# Patient Record
Sex: Male | Born: 1945 | Race: White | Hispanic: No | Marital: Married | Smoking: Former smoker
Health system: Southern US, Community
[De-identification: ages and names within clinical notes are randomized; demographics above are authoritative.]

## PROBLEM LIST (undated history)

## (undated) DIAGNOSIS — R972 Elevated prostate specific antigen [PSA]: Secondary | ICD-10-CM

## (undated) DIAGNOSIS — I714 Abdominal aortic aneurysm, without rupture, unspecified: Secondary | ICD-10-CM

## (undated) DIAGNOSIS — I10 Essential (primary) hypertension: Secondary | ICD-10-CM

## (undated) DIAGNOSIS — C61 Malignant neoplasm of prostate: Secondary | ICD-10-CM

## (undated) DIAGNOSIS — I2699 Other pulmonary embolism without acute cor pulmonale: Secondary | ICD-10-CM

## (undated) DIAGNOSIS — J449 Chronic obstructive pulmonary disease, unspecified: Secondary | ICD-10-CM

## (undated) DIAGNOSIS — I82409 Acute embolism and thrombosis of unspecified deep veins of unspecified lower extremity: Secondary | ICD-10-CM

## (undated) HISTORY — PX: CATARACT EXTRACTION: SUR2

## (undated) HISTORY — PX: TOTAL HIP ARTHROPLASTY: SHX124

## (undated) HISTORY — PX: KNEE SURGERY: SHX244

---

## 2008-06-16 HISTORY — PX: OTHER SURGICAL HISTORY: SHX169

## 2008-10-09 ENCOUNTER — Inpatient Hospital Stay: Payer: Self-pay | Admitting: Internal Medicine

## 2008-11-23 ENCOUNTER — Ambulatory Visit: Payer: Self-pay | Admitting: Family Medicine

## 2009-01-24 ENCOUNTER — Ambulatory Visit: Payer: Self-pay | Admitting: Family Medicine

## 2009-03-27 ENCOUNTER — Ambulatory Visit: Payer: Self-pay | Admitting: Ophthalmology

## 2009-04-10 ENCOUNTER — Ambulatory Visit: Payer: Self-pay | Admitting: Ophthalmology

## 2009-05-04 DIAGNOSIS — E559 Vitamin D deficiency, unspecified: Secondary | ICD-10-CM | POA: Insufficient documentation

## 2009-09-03 ENCOUNTER — Emergency Department: Payer: Self-pay | Admitting: Emergency Medicine

## 2009-10-03 ENCOUNTER — Ambulatory Visit: Payer: Self-pay | Admitting: Family Medicine

## 2010-06-03 ENCOUNTER — Ambulatory Visit: Payer: Self-pay | Admitting: Family Medicine

## 2010-07-18 ENCOUNTER — Emergency Department: Payer: Self-pay | Admitting: Emergency Medicine

## 2010-07-24 ENCOUNTER — Ambulatory Visit: Payer: Self-pay | Admitting: Family Medicine

## 2011-06-25 DIAGNOSIS — I10 Essential (primary) hypertension: Secondary | ICD-10-CM | POA: Diagnosis not present

## 2011-06-25 DIAGNOSIS — J449 Chronic obstructive pulmonary disease, unspecified: Secondary | ICD-10-CM | POA: Diagnosis not present

## 2011-06-25 DIAGNOSIS — E7800 Pure hypercholesterolemia, unspecified: Secondary | ICD-10-CM | POA: Diagnosis not present

## 2011-06-25 DIAGNOSIS — I2699 Other pulmonary embolism without acute cor pulmonale: Secondary | ICD-10-CM | POA: Diagnosis not present

## 2011-07-23 DIAGNOSIS — J449 Chronic obstructive pulmonary disease, unspecified: Secondary | ICD-10-CM | POA: Diagnosis not present

## 2011-07-23 DIAGNOSIS — I10 Essential (primary) hypertension: Secondary | ICD-10-CM | POA: Diagnosis not present

## 2011-07-23 DIAGNOSIS — E7800 Pure hypercholesterolemia, unspecified: Secondary | ICD-10-CM | POA: Diagnosis not present

## 2011-07-23 DIAGNOSIS — M069 Rheumatoid arthritis, unspecified: Secondary | ICD-10-CM | POA: Diagnosis not present

## 2011-07-23 DIAGNOSIS — M810 Age-related osteoporosis without current pathological fracture: Secondary | ICD-10-CM | POA: Diagnosis not present

## 2011-07-23 DIAGNOSIS — I2699 Other pulmonary embolism without acute cor pulmonale: Secondary | ICD-10-CM | POA: Diagnosis not present

## 2011-07-28 DIAGNOSIS — J90 Pleural effusion, not elsewhere classified: Secondary | ICD-10-CM | POA: Diagnosis not present

## 2011-07-28 DIAGNOSIS — J438 Other emphysema: Secondary | ICD-10-CM | POA: Diagnosis not present

## 2011-07-28 DIAGNOSIS — I714 Abdominal aortic aneurysm, without rupture: Secondary | ICD-10-CM | POA: Diagnosis not present

## 2011-07-28 DIAGNOSIS — I7103 Dissection of thoracoabdominal aorta: Secondary | ICD-10-CM | POA: Diagnosis not present

## 2011-07-28 DIAGNOSIS — K7689 Other specified diseases of liver: Secondary | ICD-10-CM | POA: Diagnosis not present

## 2011-08-20 DIAGNOSIS — E7800 Pure hypercholesterolemia, unspecified: Secondary | ICD-10-CM | POA: Diagnosis not present

## 2011-08-20 DIAGNOSIS — I2699 Other pulmonary embolism without acute cor pulmonale: Secondary | ICD-10-CM | POA: Diagnosis not present

## 2011-08-20 DIAGNOSIS — J449 Chronic obstructive pulmonary disease, unspecified: Secondary | ICD-10-CM | POA: Diagnosis not present

## 2011-08-20 DIAGNOSIS — I10 Essential (primary) hypertension: Secondary | ICD-10-CM | POA: Diagnosis not present

## 2011-08-25 DIAGNOSIS — I2699 Other pulmonary embolism without acute cor pulmonale: Secondary | ICD-10-CM | POA: Diagnosis not present

## 2011-08-25 DIAGNOSIS — E7800 Pure hypercholesterolemia, unspecified: Secondary | ICD-10-CM | POA: Diagnosis not present

## 2011-08-25 DIAGNOSIS — D509 Iron deficiency anemia, unspecified: Secondary | ICD-10-CM | POA: Diagnosis not present

## 2011-08-25 DIAGNOSIS — I10 Essential (primary) hypertension: Secondary | ICD-10-CM | POA: Diagnosis not present

## 2011-09-17 DIAGNOSIS — I2699 Other pulmonary embolism without acute cor pulmonale: Secondary | ICD-10-CM | POA: Diagnosis not present

## 2011-09-22 DIAGNOSIS — Z79899 Other long term (current) drug therapy: Secondary | ICD-10-CM | POA: Diagnosis not present

## 2011-09-25 DIAGNOSIS — M069 Rheumatoid arthritis, unspecified: Secondary | ICD-10-CM | POA: Diagnosis not present

## 2011-09-25 DIAGNOSIS — M810 Age-related osteoporosis without current pathological fracture: Secondary | ICD-10-CM | POA: Diagnosis not present

## 2011-10-15 DIAGNOSIS — I10 Essential (primary) hypertension: Secondary | ICD-10-CM | POA: Diagnosis not present

## 2011-10-15 DIAGNOSIS — I2699 Other pulmonary embolism without acute cor pulmonale: Secondary | ICD-10-CM | POA: Diagnosis not present

## 2011-10-15 DIAGNOSIS — E7800 Pure hypercholesterolemia, unspecified: Secondary | ICD-10-CM | POA: Diagnosis not present

## 2011-11-19 DIAGNOSIS — Z7901 Long term (current) use of anticoagulants: Secondary | ICD-10-CM | POA: Diagnosis not present

## 2011-11-19 DIAGNOSIS — I2699 Other pulmonary embolism without acute cor pulmonale: Secondary | ICD-10-CM | POA: Diagnosis not present

## 2011-11-19 DIAGNOSIS — J449 Chronic obstructive pulmonary disease, unspecified: Secondary | ICD-10-CM | POA: Diagnosis not present

## 2011-11-19 DIAGNOSIS — J069 Acute upper respiratory infection, unspecified: Secondary | ICD-10-CM | POA: Diagnosis not present

## 2011-12-08 ENCOUNTER — Ambulatory Visit: Payer: Self-pay | Admitting: Family Medicine

## 2011-12-08 DIAGNOSIS — Z7901 Long term (current) use of anticoagulants: Secondary | ICD-10-CM | POA: Diagnosis not present

## 2011-12-08 DIAGNOSIS — J90 Pleural effusion, not elsewhere classified: Secondary | ICD-10-CM | POA: Diagnosis not present

## 2011-12-08 DIAGNOSIS — E559 Vitamin D deficiency, unspecified: Secondary | ICD-10-CM | POA: Diagnosis not present

## 2011-12-08 DIAGNOSIS — J449 Chronic obstructive pulmonary disease, unspecified: Secondary | ICD-10-CM | POA: Diagnosis not present

## 2011-12-08 DIAGNOSIS — J209 Acute bronchitis, unspecified: Secondary | ICD-10-CM | POA: Diagnosis not present

## 2011-12-08 DIAGNOSIS — I10 Essential (primary) hypertension: Secondary | ICD-10-CM | POA: Diagnosis not present

## 2011-12-08 DIAGNOSIS — E7800 Pure hypercholesterolemia, unspecified: Secondary | ICD-10-CM | POA: Diagnosis not present

## 2011-12-08 DIAGNOSIS — I2699 Other pulmonary embolism without acute cor pulmonale: Secondary | ICD-10-CM | POA: Diagnosis not present

## 2011-12-08 DIAGNOSIS — E538 Deficiency of other specified B group vitamins: Secondary | ICD-10-CM | POA: Diagnosis not present

## 2011-12-08 DIAGNOSIS — R059 Cough, unspecified: Secondary | ICD-10-CM | POA: Diagnosis not present

## 2011-12-31 DIAGNOSIS — Z79899 Other long term (current) drug therapy: Secondary | ICD-10-CM | POA: Diagnosis not present

## 2011-12-31 DIAGNOSIS — M069 Rheumatoid arthritis, unspecified: Secondary | ICD-10-CM | POA: Diagnosis not present

## 2012-01-07 DIAGNOSIS — Z7901 Long term (current) use of anticoagulants: Secondary | ICD-10-CM | POA: Diagnosis not present

## 2012-01-07 DIAGNOSIS — E559 Vitamin D deficiency, unspecified: Secondary | ICD-10-CM | POA: Diagnosis not present

## 2012-01-07 DIAGNOSIS — J209 Acute bronchitis, unspecified: Secondary | ICD-10-CM | POA: Diagnosis not present

## 2012-01-07 DIAGNOSIS — J449 Chronic obstructive pulmonary disease, unspecified: Secondary | ICD-10-CM | POA: Diagnosis not present

## 2012-01-27 DIAGNOSIS — M069 Rheumatoid arthritis, unspecified: Secondary | ICD-10-CM | POA: Diagnosis not present

## 2012-02-11 DIAGNOSIS — Z7901 Long term (current) use of anticoagulants: Secondary | ICD-10-CM | POA: Diagnosis not present

## 2012-02-11 DIAGNOSIS — J209 Acute bronchitis, unspecified: Secondary | ICD-10-CM | POA: Diagnosis not present

## 2012-02-11 DIAGNOSIS — J449 Chronic obstructive pulmonary disease, unspecified: Secondary | ICD-10-CM | POA: Diagnosis not present

## 2012-02-11 DIAGNOSIS — E559 Vitamin D deficiency, unspecified: Secondary | ICD-10-CM | POA: Diagnosis not present

## 2012-03-10 DIAGNOSIS — I2699 Other pulmonary embolism without acute cor pulmonale: Secondary | ICD-10-CM | POA: Diagnosis not present

## 2012-03-10 DIAGNOSIS — E7800 Pure hypercholesterolemia, unspecified: Secondary | ICD-10-CM | POA: Diagnosis not present

## 2012-03-10 DIAGNOSIS — Z0000 Encounter for general adult medical examination without abnormal findings: Secondary | ICD-10-CM | POA: Diagnosis not present

## 2012-03-10 DIAGNOSIS — I10 Essential (primary) hypertension: Secondary | ICD-10-CM | POA: Diagnosis not present

## 2012-03-10 DIAGNOSIS — Z1159 Encounter for screening for other viral diseases: Secondary | ICD-10-CM | POA: Diagnosis not present

## 2012-03-10 DIAGNOSIS — Z86718 Personal history of other venous thrombosis and embolism: Secondary | ICD-10-CM | POA: Diagnosis not present

## 2012-03-10 DIAGNOSIS — Z125 Encounter for screening for malignant neoplasm of prostate: Secondary | ICD-10-CM | POA: Diagnosis not present

## 2012-03-10 DIAGNOSIS — J449 Chronic obstructive pulmonary disease, unspecified: Secondary | ICD-10-CM | POA: Diagnosis not present

## 2012-03-31 DIAGNOSIS — E559 Vitamin D deficiency, unspecified: Secondary | ICD-10-CM | POA: Diagnosis not present

## 2012-03-31 DIAGNOSIS — R972 Elevated prostate specific antigen [PSA]: Secondary | ICD-10-CM | POA: Diagnosis not present

## 2012-04-08 DIAGNOSIS — Z86711 Personal history of pulmonary embolism: Secondary | ICD-10-CM | POA: Diagnosis not present

## 2012-04-08 DIAGNOSIS — Z7901 Long term (current) use of anticoagulants: Secondary | ICD-10-CM | POA: Diagnosis not present

## 2012-04-20 DIAGNOSIS — R972 Elevated prostate specific antigen [PSA]: Secondary | ICD-10-CM | POA: Diagnosis not present

## 2012-05-04 DIAGNOSIS — M069 Rheumatoid arthritis, unspecified: Secondary | ICD-10-CM | POA: Diagnosis not present

## 2012-05-04 DIAGNOSIS — Z79899 Other long term (current) drug therapy: Secondary | ICD-10-CM | POA: Diagnosis not present

## 2012-05-19 DIAGNOSIS — R972 Elevated prostate specific antigen [PSA]: Secondary | ICD-10-CM | POA: Diagnosis not present

## 2012-05-19 DIAGNOSIS — N429 Disorder of prostate, unspecified: Secondary | ICD-10-CM | POA: Diagnosis not present

## 2012-05-19 DIAGNOSIS — C61 Malignant neoplasm of prostate: Secondary | ICD-10-CM | POA: Diagnosis not present

## 2012-05-19 HISTORY — PX: PROSTATE BIOPSY: SHX241

## 2012-05-26 DIAGNOSIS — I714 Abdominal aortic aneurysm, without rupture: Secondary | ICD-10-CM | POA: Diagnosis not present

## 2012-05-26 DIAGNOSIS — Z86718 Personal history of other venous thrombosis and embolism: Secondary | ICD-10-CM | POA: Diagnosis not present

## 2012-05-28 ENCOUNTER — Ambulatory Visit: Payer: Self-pay | Admitting: Family Medicine

## 2012-05-28 DIAGNOSIS — I714 Abdominal aortic aneurysm, without rupture: Secondary | ICD-10-CM | POA: Diagnosis not present

## 2012-05-28 DIAGNOSIS — Z86718 Personal history of other venous thrombosis and embolism: Secondary | ICD-10-CM | POA: Diagnosis not present

## 2012-06-02 DIAGNOSIS — C61 Malignant neoplasm of prostate: Secondary | ICD-10-CM | POA: Diagnosis not present

## 2012-06-23 DIAGNOSIS — M7020 Olecranon bursitis, unspecified elbow: Secondary | ICD-10-CM | POA: Diagnosis not present

## 2012-06-23 DIAGNOSIS — I2699 Other pulmonary embolism without acute cor pulmonale: Secondary | ICD-10-CM | POA: Diagnosis not present

## 2012-06-23 DIAGNOSIS — L0291 Cutaneous abscess, unspecified: Secondary | ICD-10-CM | POA: Diagnosis not present

## 2012-06-28 DIAGNOSIS — M7020 Olecranon bursitis, unspecified elbow: Secondary | ICD-10-CM | POA: Diagnosis not present

## 2012-06-28 DIAGNOSIS — I2699 Other pulmonary embolism without acute cor pulmonale: Secondary | ICD-10-CM | POA: Diagnosis not present

## 2012-06-28 DIAGNOSIS — H35389 Toxic maculopathy, unspecified eye: Secondary | ICD-10-CM | POA: Diagnosis not present

## 2012-06-30 DIAGNOSIS — C61 Malignant neoplasm of prostate: Secondary | ICD-10-CM | POA: Diagnosis not present

## 2012-07-06 DIAGNOSIS — L0291 Cutaneous abscess, unspecified: Secondary | ICD-10-CM | POA: Diagnosis not present

## 2012-07-06 DIAGNOSIS — L0390 Cellulitis, unspecified: Secondary | ICD-10-CM | POA: Diagnosis not present

## 2012-07-06 DIAGNOSIS — I2699 Other pulmonary embolism without acute cor pulmonale: Secondary | ICD-10-CM | POA: Diagnosis not present

## 2012-07-20 DIAGNOSIS — I2699 Other pulmonary embolism without acute cor pulmonale: Secondary | ICD-10-CM | POA: Diagnosis not present

## 2012-07-20 DIAGNOSIS — L0291 Cutaneous abscess, unspecified: Secondary | ICD-10-CM | POA: Diagnosis not present

## 2012-07-20 DIAGNOSIS — L0390 Cellulitis, unspecified: Secondary | ICD-10-CM | POA: Diagnosis not present

## 2012-07-27 DIAGNOSIS — M069 Rheumatoid arthritis, unspecified: Secondary | ICD-10-CM | POA: Diagnosis not present

## 2012-07-27 DIAGNOSIS — Z79899 Other long term (current) drug therapy: Secondary | ICD-10-CM | POA: Diagnosis not present

## 2012-08-03 DIAGNOSIS — M069 Rheumatoid arthritis, unspecified: Secondary | ICD-10-CM | POA: Diagnosis not present

## 2012-08-03 DIAGNOSIS — D649 Anemia, unspecified: Secondary | ICD-10-CM | POA: Diagnosis not present

## 2012-08-05 DIAGNOSIS — L0390 Cellulitis, unspecified: Secondary | ICD-10-CM | POA: Diagnosis not present

## 2012-08-05 DIAGNOSIS — I2699 Other pulmonary embolism without acute cor pulmonale: Secondary | ICD-10-CM | POA: Diagnosis not present

## 2012-08-05 DIAGNOSIS — Z7901 Long term (current) use of anticoagulants: Secondary | ICD-10-CM | POA: Diagnosis not present

## 2012-08-09 DIAGNOSIS — J438 Other emphysema: Secondary | ICD-10-CM | POA: Diagnosis not present

## 2012-08-09 DIAGNOSIS — J9819 Other pulmonary collapse: Secondary | ICD-10-CM | POA: Diagnosis not present

## 2012-08-09 DIAGNOSIS — J90 Pleural effusion, not elsewhere classified: Secondary | ICD-10-CM | POA: Diagnosis not present

## 2012-08-09 DIAGNOSIS — I714 Abdominal aortic aneurysm, without rupture: Secondary | ICD-10-CM | POA: Diagnosis not present

## 2012-08-09 DIAGNOSIS — I7103 Dissection of thoracoabdominal aorta: Secondary | ICD-10-CM | POA: Diagnosis not present

## 2012-08-19 DIAGNOSIS — I2699 Other pulmonary embolism without acute cor pulmonale: Secondary | ICD-10-CM | POA: Diagnosis not present

## 2012-08-19 DIAGNOSIS — E785 Hyperlipidemia, unspecified: Secondary | ICD-10-CM | POA: Diagnosis not present

## 2012-08-19 DIAGNOSIS — J449 Chronic obstructive pulmonary disease, unspecified: Secondary | ICD-10-CM | POA: Diagnosis not present

## 2012-08-19 DIAGNOSIS — I10 Essential (primary) hypertension: Secondary | ICD-10-CM | POA: Diagnosis not present

## 2012-09-06 DIAGNOSIS — I10 Essential (primary) hypertension: Secondary | ICD-10-CM | POA: Diagnosis not present

## 2012-09-06 DIAGNOSIS — E7800 Pure hypercholesterolemia, unspecified: Secondary | ICD-10-CM | POA: Diagnosis not present

## 2012-09-13 DIAGNOSIS — C61 Malignant neoplasm of prostate: Secondary | ICD-10-CM | POA: Diagnosis not present

## 2012-09-23 DIAGNOSIS — Z86711 Personal history of pulmonary embolism: Secondary | ICD-10-CM | POA: Diagnosis not present

## 2012-09-23 DIAGNOSIS — J449 Chronic obstructive pulmonary disease, unspecified: Secondary | ICD-10-CM | POA: Diagnosis not present

## 2012-09-23 DIAGNOSIS — I2699 Other pulmonary embolism without acute cor pulmonale: Secondary | ICD-10-CM | POA: Diagnosis not present

## 2012-09-23 DIAGNOSIS — I10 Essential (primary) hypertension: Secondary | ICD-10-CM | POA: Diagnosis not present

## 2012-09-29 ENCOUNTER — Telehealth: Payer: Self-pay | Admitting: *Deleted

## 2012-09-29 NOTE — Telephone Encounter (Signed)
CALLED PATIENT TO ALTER [place] VISIT FOR 10-07-12 DUE TO DR. Dayton Scrape BEING IN THE OR, RESCHEDULED FOR 10-06-12 - 2:30 PM FOR 3:00 PM APPT., PATIENT AGREED TO NEW TIME AND DATE

## 2012-09-30 ENCOUNTER — Ambulatory Visit: Payer: Self-pay | Admitting: Radiation Oncology

## 2012-09-30 ENCOUNTER — Ambulatory Visit: Payer: Self-pay

## 2012-10-06 ENCOUNTER — Encounter: Payer: Self-pay | Admitting: Oncology

## 2012-10-06 ENCOUNTER — Ambulatory Visit
Admission: RE | Admit: 2012-10-06 | Discharge: 2012-10-06 | Disposition: A | Discharge status: Home / Self Care | Payer: Medicare Other | Source: Ambulatory Visit | Attending: Radiation Oncology | Admitting: Radiation Oncology

## 2012-10-06 VITALS — BP 106/62 | HR 70 | Temp 97.6°F | Ht 68.0 in | Wt 151.7 lb

## 2012-10-06 DIAGNOSIS — Z86718 Personal history of other venous thrombosis and embolism: Secondary | ICD-10-CM | POA: Diagnosis not present

## 2012-10-06 DIAGNOSIS — Z96649 Presence of unspecified artificial hip joint: Secondary | ICD-10-CM | POA: Diagnosis not present

## 2012-10-06 DIAGNOSIS — C61 Malignant neoplasm of prostate: Secondary | ICD-10-CM | POA: Diagnosis not present

## 2012-10-06 DIAGNOSIS — Z86711 Personal history of pulmonary embolism: Secondary | ICD-10-CM | POA: Insufficient documentation

## 2012-10-06 DIAGNOSIS — I10 Essential (primary) hypertension: Secondary | ICD-10-CM | POA: Insufficient documentation

## 2012-10-06 DIAGNOSIS — I714 Abdominal aortic aneurysm, without rupture, unspecified: Secondary | ICD-10-CM | POA: Insufficient documentation

## 2012-10-06 DIAGNOSIS — J449 Chronic obstructive pulmonary disease, unspecified: Secondary | ICD-10-CM | POA: Diagnosis not present

## 2012-10-06 DIAGNOSIS — Z7901 Long term (current) use of anticoagulants: Secondary | ICD-10-CM | POA: Insufficient documentation

## 2012-10-06 DIAGNOSIS — Z87891 Personal history of nicotine dependence: Secondary | ICD-10-CM | POA: Diagnosis not present

## 2012-10-06 DIAGNOSIS — J4489 Other specified chronic obstructive pulmonary disease: Secondary | ICD-10-CM | POA: Insufficient documentation

## 2012-10-06 HISTORY — DX: Acute embolism and thrombosis of unspecified deep veins of unspecified lower extremity: I82.409

## 2012-10-06 HISTORY — DX: Chronic obstructive pulmonary disease, unspecified: J44.9

## 2012-10-06 HISTORY — DX: Other pulmonary embolism without acute cor pulmonale: I26.99

## 2012-10-06 HISTORY — DX: Abdominal aortic aneurysm, without rupture: I71.4

## 2012-10-06 HISTORY — DX: Abdominal aortic aneurysm, without rupture, unspecified: I71.40

## 2012-10-06 HISTORY — DX: Elevated prostate specific antigen (PSA): R97.20

## 2012-10-06 HISTORY — DX: Essential (primary) hypertension: I10

## 2012-10-06 HISTORY — DX: Malignant neoplasm of prostate: C61

## 2012-10-06 NOTE — Progress Notes (Addendum)
[facility] Radiation Oncology NEW PATIENT EVALUATION  Name: Juan Fernandez MRN: [id number]  Date:   10/06/2012           DOB: 1946-01-13  Status: outpatient   CC: Clydell Hakim, MD  Sebastian Ache, MD , Dr. Carmina Miller, FAX # [phone]   REFERRING PHYSICIAN: Sebastian Ache, MD   DIAGNOSIS: Stage TI C. intermediate risk adenocarcinoma prostate   HISTORY OF PRESENT ILLNESS:  Juan Fernandez is a 67 y.o. male who is seen today for the courtesy of Dr. Berneice Heinrich for evaluation of his stage TI C. intermediate risk adenocarcinoma prostate. He was initially evaluated by Dr. Jamse Belfast, his primary care physician, who noted a rise in his PSA to 5.7 with a free PSA percentage of 10.2. I do not have his previous PSA determinations. He was referred to Dr. Orson Slick who performed ultrasound-guided biopsies on 05/19/2012. He was found to have 4 of 12 specimens diagnostic for adenocarcinoma. He had Gleason 7 (4+3) involving 46% of the total biopsy length from the right base. He had Gleason 7 (3+4) involving 35% of one core from the right mid gland and 1% of one core from the left lateral apex. Gleason 6 (3+3) was found in 7% of one core from the left base. His gland volume was approximately 28 cc. He is doing recently well from a GU and GI standpoint. His I PSS score is 4. He does have erectile dysfunction. 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 Of note is that he had a right hip replacement. He was seen by Dr. Berneice Heinrich for consideration of surgery, and he felt that he may be a better candidate for radiation therapy rather than surgery.  PREVIOUS RADIATION THERAPY: No   PAST MEDICAL HISTORY:  has a past medical history of Prostate cancer; Elevated PSA; DVT (deep venous thrombosis); PE (pulmonary embolism); COPD (chronic obstructive pulmonary disease);  AAA (abdominal aortic aneurysm); and HTN (hypertension).     PAST SURGICAL HISTORY:  Past Surgical History  Procedure Laterality Date  . Total hip arthroplasty Right   . Knee surgery Left   . Prostate biopsy  05/19/2012    Gleason 3+3=6 l base, 3+4=7 RM and LA, RMB  . Ivc filter  2010     FAMILY HISTORY: His father's health is unknown. His mother died from complications of COPD and non-Hodgkin's lymphoma at age 26. No known family history of prostate cancer.  SOCIAL HISTORY:  reports that he quit smoking about 9 years ago. He started smoking about 50 years ago. He does not have any smokeless tobacco history on file. He reports that he does not drink alcohol or use illicit drugs. Were for the past 3 years, 3 children. Retired after 20 years in the [employer]. 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  ALLERGIES: Review of patient's allergies indicates not on file.   MEDICATIONS:  Current Outpatient Prescriptions  Medication Sig Dispense Refill  . alendronate (FOSAMAX) 10 MG tablet Take 20 mg by mouth once a week. Take with a full glass of water on an empty stomach.      Marland Kitchen aspirin 325 MG tablet Take 325 mg by mouth daily.      . calcium carbonate (OS-CAL) 600 MG TABS Take 600 mg by mouth 2 (two) times daily with a meal.      . Cyanocobalamin (NASCOBAL) 500 MCG/0.1ML SOLN Place into  the nose daily.      . enalapril (VASOTEC) 5 MG tablet Take 5 mg by mouth 2 (two) times daily.      Marland Kitchen esomeprazole (NEXIUM) 40 MG capsule Take 40 mg by mouth 2 (two) times daily.      . folic acid (FOLVITE) 1 MG tablet Take 1 mg by mouth daily.      . hydroxychloroquine (PLAQUENIL) 200 MG tablet Take by mouth daily.      . methotrexate (RHEUMATREX) 2.5 MG tablet Take 2.5 mg by mouth once a week. Caution:Chemotherapy. Protect from light.      . metoprolol (LOPRESSOR) 50 MG tablet Take 25 mg by mouth daily.      . metoprolol succinate (TOPROL-XL) 50 MG 24 hr tablet Take 50 mg by mouth daily. Take with or immediately  following a meal.      . mometasone (NASONEX) 50 MCG/ACT nasal spray Place 2 sprays into the nose.      . simvastatin (ZOCOR) 20 MG tablet Take 20 mg by mouth every evening.      . tiotropium (SPIRIVA) 18 MCG inhalation capsule Place 18 mcg into inhaler and inhale daily.      Marland Kitchen warfarin (COUMADIN) 5 MG tablet Take 5 mg by mouth daily. Takes 1/2 tablet Tuesday, Thursday, Sat and Sunday.  Takes full tablet Monday, Wednesday and Friday      . ferrous fumarate (HEMOCYTE - 106 MG FE) 325 (106 FE) MG TABS Take 1 tablet by mouth.       No current facility-administered medications for this encounter.     REVIEW OF SYSTEMS:  Pertinent items are noted in HPI.    PHYSICAL EXAM:  height is 5\' 8"  (1.727 m) and weight is 151 lb 11.2 oz (68.811 kg). His temperature is 97.6 F (36.4 C). His blood pressure is 106/62 and his pulse is 70.   Alert and oriented. He is in no acute respiratory distress. Head and neck examination: Grossly unremarkable. Nodes: Without palpable cervical, or supraclavicular lymphadenopathy. Chest: Lungs clear but breath sounds distant. Heart: Regular in rhythm. Back: Without spinal or CVA tenderness. Abdomen: Without masses organomegaly. Genitalia: Unremarkable to inspection. Rectal: The prostate gland is normal in size and is without focal induration or nodularity. Extremities: Without edema. Neurologic examination: Grossly nonfocal.   LABORATORY DATA:  No results found for this basename: WBC, HGB, HCT, MCV, PLT   No results found for this basename: NA, K, CL, CO2   No results found for this basename: ALT, AST, GGT, ALKPHOS, BILITOT   PSA 5.7   IMPRESSION: Stage TI C. intermediate risk adenocarcinoma prostate. I explained to the patient and his daughter that his prognosis is related to his stage, PSA level, and Gleason score. His stage and PSA level are favorable while his Gleason score of 7 is of intermediate favorability. I do not feel strongly that he needs to have a bone scan,  or CT scan for staging purposes. We discussed surgery versus close surveillance with delayed androgen deprivation therapy, and radiation therapy. Radiation therapy options include seed implantation with or without 5 weeks of external beam radiation therapy or 8 weeks of external beam/IMRT. We discussed the potential acute and late toxicities of radiation therapy. After lengthy discussion he is most interested in external beam/IMRT which I think would be a good choice for him. I told him  that he can certainly have his treatment closer to home with Dr. Carmina Miller in [place]. Dr. Rushie Chestnut may ask Dr. Berneice Heinrich to  placed 3 gold markers within the prostate for image guidance during his IMRT. Understand that he is currently undergoing evaluation for his abdominal aortic aneurysm, and this will need to be coordinated in [place].   PLAN: As discussed above. The patient see Dr. Berneice Heinrich this coming Monday, April 28 , and he can make a formal referral to Dr. Rushie Chestnut. I spoke with Dr. Rushie Chestnut this afternoon, and he will await a call from Dr. Berneice Heinrich.   I spent 60 minutes minutes face to face with the patient and more than 50% of that time was spent in counseling and/or coordination of care.

## 2012-10-06 NOTE — Progress Notes (Signed)
GU Location of Tumor / Histology: prostate  If Prostate Cancer, Gleason Score is (3 + 3=6 L Base, 3+4=7 RM, LA and RMB) and PSA is (5.7) and prostate volume is 26 mL  Patient presented with signs/symptoms of: elevated PSA  Biopsies of prostate (if applicable) revealed: adenocarcinoma  Past/Anticipated interventions by urology, if any: prostate biopsy, possible prostatectomy  Past/Anticipated interventions by medical oncology, if any: none  Weight changes, if any:  no  Bowel/Bladder complaints, if any:   Occasional weak stream, trouble emptying bladder  Nausea/Vomiting, if any:  no  Pain issues, if any: no    SAFETY ISSUES:  Prior radiation? no  Pacemaker/ICD? no   Possible current pregnancy? no  Is the patient on methotrexate?  yes  Current Complaints / other details:   Juan Fernandez here for consult for prostate cancer.  He denies pain, fatigue, diarrhea, nocturia and hematuria.

## 2012-10-06 NOTE — Progress Notes (Signed)
Please see the Nurse Progress Note in the MD Initial Consult Encounter for this patient. 

## 2012-10-07 ENCOUNTER — Telehealth: Payer: Self-pay | Admitting: Radiation Oncology

## 2012-10-07 ENCOUNTER — Ambulatory Visit: Payer: Medicare Other | Admitting: Radiation Oncology

## 2012-10-07 ENCOUNTER — Ambulatory Visit: Payer: Medicare Other

## 2012-10-07 NOTE — Telephone Encounter (Signed)
Per Dr. Dayton Scrape, faxed chart to Dr. Rushie Chestnut.  Received confirmation.

## 2012-10-11 DIAGNOSIS — C61 Malignant neoplasm of prostate: Secondary | ICD-10-CM | POA: Diagnosis not present

## 2012-10-12 DIAGNOSIS — J449 Chronic obstructive pulmonary disease, unspecified: Secondary | ICD-10-CM | POA: Diagnosis not present

## 2012-10-12 DIAGNOSIS — Z86711 Personal history of pulmonary embolism: Secondary | ICD-10-CM | POA: Diagnosis not present

## 2012-10-12 DIAGNOSIS — I2699 Other pulmonary embolism without acute cor pulmonale: Secondary | ICD-10-CM | POA: Diagnosis not present

## 2012-10-12 DIAGNOSIS — I10 Essential (primary) hypertension: Secondary | ICD-10-CM | POA: Diagnosis not present

## 2012-10-13 NOTE — Addendum Note (Signed)
Encounter addended by: Shavanna Furnari Mintz Madie Cahn, RN on: 10/13/2012  6:50 PM<BR>     Documentation filed: Charges VN

## 2012-10-18 ENCOUNTER — Ambulatory Visit: Payer: Self-pay | Admitting: Radiation Oncology

## 2012-10-18 DIAGNOSIS — K219 Gastro-esophageal reflux disease without esophagitis: Secondary | ICD-10-CM | POA: Diagnosis not present

## 2012-10-18 DIAGNOSIS — Z79899 Other long term (current) drug therapy: Secondary | ICD-10-CM | POA: Diagnosis not present

## 2012-10-18 DIAGNOSIS — Z5181 Encounter for therapeutic drug level monitoring: Secondary | ICD-10-CM | POA: Diagnosis not present

## 2012-10-18 DIAGNOSIS — M069 Rheumatoid arthritis, unspecified: Secondary | ICD-10-CM | POA: Diagnosis not present

## 2012-10-18 DIAGNOSIS — Z7901 Long term (current) use of anticoagulants: Secondary | ICD-10-CM | POA: Diagnosis not present

## 2012-10-18 DIAGNOSIS — Z86718 Personal history of other venous thrombosis and embolism: Secondary | ICD-10-CM | POA: Diagnosis not present

## 2012-10-18 DIAGNOSIS — J449 Chronic obstructive pulmonary disease, unspecified: Secondary | ICD-10-CM | POA: Diagnosis not present

## 2012-10-18 DIAGNOSIS — I252 Old myocardial infarction: Secondary | ICD-10-CM | POA: Diagnosis not present

## 2012-10-18 DIAGNOSIS — Z7982 Long term (current) use of aspirin: Secondary | ICD-10-CM | POA: Diagnosis not present

## 2012-10-18 DIAGNOSIS — Z510 Encounter for antineoplastic radiation therapy: Secondary | ICD-10-CM | POA: Diagnosis not present

## 2012-10-18 DIAGNOSIS — E7800 Pure hypercholesterolemia, unspecified: Secondary | ICD-10-CM | POA: Diagnosis not present

## 2012-10-18 DIAGNOSIS — M810 Age-related osteoporosis without current pathological fracture: Secondary | ICD-10-CM | POA: Diagnosis not present

## 2012-10-18 DIAGNOSIS — E785 Hyperlipidemia, unspecified: Secondary | ICD-10-CM | POA: Diagnosis not present

## 2012-10-18 DIAGNOSIS — C61 Malignant neoplasm of prostate: Secondary | ICD-10-CM | POA: Diagnosis not present

## 2012-10-18 DIAGNOSIS — I10 Essential (primary) hypertension: Secondary | ICD-10-CM | POA: Diagnosis not present

## 2012-10-26 DIAGNOSIS — M069 Rheumatoid arthritis, unspecified: Secondary | ICD-10-CM | POA: Diagnosis not present

## 2012-11-02 DIAGNOSIS — IMO0002 Reserved for concepts with insufficient information to code with codable children: Secondary | ICD-10-CM | POA: Diagnosis not present

## 2012-11-02 DIAGNOSIS — C61 Malignant neoplasm of prostate: Secondary | ICD-10-CM | POA: Diagnosis not present

## 2012-11-09 DIAGNOSIS — C61 Malignant neoplasm of prostate: Secondary | ICD-10-CM | POA: Diagnosis not present

## 2012-11-10 DIAGNOSIS — C61 Malignant neoplasm of prostate: Secondary | ICD-10-CM | POA: Diagnosis not present

## 2012-11-14 ENCOUNTER — Ambulatory Visit: Payer: Self-pay | Admitting: Radiation Oncology

## 2012-11-14 DIAGNOSIS — M069 Rheumatoid arthritis, unspecified: Secondary | ICD-10-CM | POA: Diagnosis not present

## 2012-11-14 DIAGNOSIS — Z86718 Personal history of other venous thrombosis and embolism: Secondary | ICD-10-CM | POA: Diagnosis not present

## 2012-11-14 DIAGNOSIS — Z79899 Other long term (current) drug therapy: Secondary | ICD-10-CM | POA: Diagnosis not present

## 2012-11-14 DIAGNOSIS — Z7982 Long term (current) use of aspirin: Secondary | ICD-10-CM | POA: Diagnosis not present

## 2012-11-14 DIAGNOSIS — Z510 Encounter for antineoplastic radiation therapy: Secondary | ICD-10-CM | POA: Diagnosis not present

## 2012-11-14 DIAGNOSIS — I252 Old myocardial infarction: Secondary | ICD-10-CM | POA: Diagnosis not present

## 2012-11-14 DIAGNOSIS — I10 Essential (primary) hypertension: Secondary | ICD-10-CM | POA: Diagnosis not present

## 2012-11-14 DIAGNOSIS — Z7901 Long term (current) use of anticoagulants: Secondary | ICD-10-CM | POA: Diagnosis not present

## 2012-11-14 DIAGNOSIS — M810 Age-related osteoporosis without current pathological fracture: Secondary | ICD-10-CM | POA: Diagnosis not present

## 2012-11-14 DIAGNOSIS — C61 Malignant neoplasm of prostate: Secondary | ICD-10-CM | POA: Diagnosis not present

## 2012-11-14 DIAGNOSIS — Z5181 Encounter for therapeutic drug level monitoring: Secondary | ICD-10-CM | POA: Diagnosis not present

## 2012-11-14 DIAGNOSIS — E7800 Pure hypercholesterolemia, unspecified: Secondary | ICD-10-CM | POA: Diagnosis not present

## 2012-11-14 DIAGNOSIS — E785 Hyperlipidemia, unspecified: Secondary | ICD-10-CM | POA: Diagnosis not present

## 2012-11-14 DIAGNOSIS — K219 Gastro-esophageal reflux disease without esophagitis: Secondary | ICD-10-CM | POA: Diagnosis not present

## 2012-11-14 DIAGNOSIS — J449 Chronic obstructive pulmonary disease, unspecified: Secondary | ICD-10-CM | POA: Diagnosis not present

## 2012-11-22 DIAGNOSIS — C61 Malignant neoplasm of prostate: Secondary | ICD-10-CM | POA: Diagnosis not present

## 2012-11-23 DIAGNOSIS — C61 Malignant neoplasm of prostate: Secondary | ICD-10-CM | POA: Diagnosis not present

## 2012-11-23 DIAGNOSIS — J449 Chronic obstructive pulmonary disease, unspecified: Secondary | ICD-10-CM | POA: Diagnosis not present

## 2012-11-23 DIAGNOSIS — I2699 Other pulmonary embolism without acute cor pulmonale: Secondary | ICD-10-CM | POA: Diagnosis not present

## 2012-11-23 DIAGNOSIS — I10 Essential (primary) hypertension: Secondary | ICD-10-CM | POA: Diagnosis not present

## 2012-11-23 DIAGNOSIS — Z86711 Personal history of pulmonary embolism: Secondary | ICD-10-CM | POA: Diagnosis not present

## 2012-11-24 DIAGNOSIS — C61 Malignant neoplasm of prostate: Secondary | ICD-10-CM | POA: Diagnosis not present

## 2012-11-25 DIAGNOSIS — C61 Malignant neoplasm of prostate: Secondary | ICD-10-CM | POA: Diagnosis not present

## 2012-11-29 DIAGNOSIS — C61 Malignant neoplasm of prostate: Secondary | ICD-10-CM | POA: Diagnosis not present

## 2012-11-30 DIAGNOSIS — C61 Malignant neoplasm of prostate: Secondary | ICD-10-CM | POA: Diagnosis not present

## 2012-12-01 DIAGNOSIS — C61 Malignant neoplasm of prostate: Secondary | ICD-10-CM | POA: Diagnosis not present

## 2012-12-02 DIAGNOSIS — C61 Malignant neoplasm of prostate: Secondary | ICD-10-CM | POA: Diagnosis not present

## 2012-12-02 LAB — CBC CANCER CENTER
Eosinophil #: 0.1 x10 3/mm (ref 0.0–0.7)
Eosinophil %: 1.7 %
HCT: 37.4 % — ABNORMAL LOW (ref 40.0–52.0)
MCH: 30.1 pg (ref 26.0–34.0)
MCHC: 34.4 g/dL (ref 32.0–36.0)
Monocyte %: 8.5 %
Neutrophil #: 3.7 x10 3/mm (ref 1.4–6.5)
RBC: 4.28 10*6/uL — ABNORMAL LOW (ref 4.40–5.90)

## 2012-12-06 DIAGNOSIS — C61 Malignant neoplasm of prostate: Secondary | ICD-10-CM | POA: Diagnosis not present

## 2012-12-07 DIAGNOSIS — I2699 Other pulmonary embolism without acute cor pulmonale: Secondary | ICD-10-CM | POA: Diagnosis not present

## 2012-12-07 DIAGNOSIS — J449 Chronic obstructive pulmonary disease, unspecified: Secondary | ICD-10-CM | POA: Diagnosis not present

## 2012-12-07 DIAGNOSIS — I10 Essential (primary) hypertension: Secondary | ICD-10-CM | POA: Diagnosis not present

## 2012-12-07 DIAGNOSIS — C61 Malignant neoplasm of prostate: Secondary | ICD-10-CM | POA: Diagnosis not present

## 2012-12-07 DIAGNOSIS — Z86711 Personal history of pulmonary embolism: Secondary | ICD-10-CM | POA: Diagnosis not present

## 2012-12-08 DIAGNOSIS — C61 Malignant neoplasm of prostate: Secondary | ICD-10-CM | POA: Diagnosis not present

## 2012-12-09 DIAGNOSIS — C61 Malignant neoplasm of prostate: Secondary | ICD-10-CM | POA: Diagnosis not present

## 2012-12-09 LAB — CBC CANCER CENTER
Basophil %: 0.5 %
Eosinophil #: 0.3 x10 3/mm (ref 0.0–0.7)
Eosinophil %: 3.3 %
HCT: 38.2 % — ABNORMAL LOW (ref 40.0–52.0)
HGB: 13.1 g/dL (ref 13.0–18.0)
Lymphocyte #: 1.6 x10 3/mm (ref 1.0–3.6)
Monocyte #: 0.7 x10 3/mm (ref 0.2–1.0)
Neutrophil #: 5.5 x10 3/mm (ref 1.4–6.5)
Neutrophil %: 67.5 %
Platelet: 203 x10 3/mm (ref 150–440)
RDW: 17.3 % — ABNORMAL HIGH (ref 11.5–14.5)
WBC: 8.2 x10 3/mm (ref 3.8–10.6)

## 2012-12-10 DIAGNOSIS — C61 Malignant neoplasm of prostate: Secondary | ICD-10-CM | POA: Diagnosis not present

## 2012-12-13 DIAGNOSIS — C61 Malignant neoplasm of prostate: Secondary | ICD-10-CM | POA: Diagnosis not present

## 2012-12-14 ENCOUNTER — Ambulatory Visit: Payer: Self-pay | Admitting: Radiation Oncology

## 2012-12-14 DIAGNOSIS — Z7901 Long term (current) use of anticoagulants: Secondary | ICD-10-CM | POA: Diagnosis not present

## 2012-12-14 DIAGNOSIS — Z7982 Long term (current) use of aspirin: Secondary | ICD-10-CM | POA: Diagnosis not present

## 2012-12-14 DIAGNOSIS — I252 Old myocardial infarction: Secondary | ICD-10-CM | POA: Diagnosis not present

## 2012-12-14 DIAGNOSIS — Z86718 Personal history of other venous thrombosis and embolism: Secondary | ICD-10-CM | POA: Diagnosis not present

## 2012-12-14 DIAGNOSIS — Z79899 Other long term (current) drug therapy: Secondary | ICD-10-CM | POA: Diagnosis not present

## 2012-12-14 DIAGNOSIS — E7800 Pure hypercholesterolemia, unspecified: Secondary | ICD-10-CM | POA: Diagnosis not present

## 2012-12-14 DIAGNOSIS — I10 Essential (primary) hypertension: Secondary | ICD-10-CM | POA: Diagnosis not present

## 2012-12-14 DIAGNOSIS — Z5181 Encounter for therapeutic drug level monitoring: Secondary | ICD-10-CM | POA: Diagnosis not present

## 2012-12-14 DIAGNOSIS — C61 Malignant neoplasm of prostate: Secondary | ICD-10-CM | POA: Diagnosis not present

## 2012-12-14 DIAGNOSIS — M069 Rheumatoid arthritis, unspecified: Secondary | ICD-10-CM | POA: Diagnosis not present

## 2012-12-14 DIAGNOSIS — J449 Chronic obstructive pulmonary disease, unspecified: Secondary | ICD-10-CM | POA: Diagnosis not present

## 2012-12-14 DIAGNOSIS — E785 Hyperlipidemia, unspecified: Secondary | ICD-10-CM | POA: Diagnosis not present

## 2012-12-14 DIAGNOSIS — M810 Age-related osteoporosis without current pathological fracture: Secondary | ICD-10-CM | POA: Diagnosis not present

## 2012-12-14 DIAGNOSIS — Z510 Encounter for antineoplastic radiation therapy: Secondary | ICD-10-CM | POA: Diagnosis not present

## 2012-12-14 DIAGNOSIS — K219 Gastro-esophageal reflux disease without esophagitis: Secondary | ICD-10-CM | POA: Diagnosis not present

## 2012-12-15 DIAGNOSIS — C61 Malignant neoplasm of prostate: Secondary | ICD-10-CM | POA: Diagnosis not present

## 2012-12-16 DIAGNOSIS — C61 Malignant neoplasm of prostate: Secondary | ICD-10-CM | POA: Diagnosis not present

## 2012-12-16 LAB — CBC CANCER CENTER
Basophil #: 0 x10 3/mm (ref 0.0–0.1)
HCT: 35.7 % — ABNORMAL LOW (ref 40.0–52.0)
Lymphocyte %: 18.9 %
Monocyte %: 9.6 %
Neutrophil %: 67.9 %
RDW: 17.4 % — ABNORMAL HIGH (ref 11.5–14.5)
WBC: 5.2 x10 3/mm (ref 3.8–10.6)

## 2012-12-20 DIAGNOSIS — C61 Malignant neoplasm of prostate: Secondary | ICD-10-CM | POA: Diagnosis not present

## 2012-12-21 DIAGNOSIS — Z86711 Personal history of pulmonary embolism: Secondary | ICD-10-CM | POA: Diagnosis not present

## 2012-12-21 DIAGNOSIS — J449 Chronic obstructive pulmonary disease, unspecified: Secondary | ICD-10-CM | POA: Diagnosis not present

## 2012-12-21 DIAGNOSIS — I2699 Other pulmonary embolism without acute cor pulmonale: Secondary | ICD-10-CM | POA: Diagnosis not present

## 2012-12-21 DIAGNOSIS — I10 Essential (primary) hypertension: Secondary | ICD-10-CM | POA: Diagnosis not present

## 2012-12-22 DIAGNOSIS — C61 Malignant neoplasm of prostate: Secondary | ICD-10-CM | POA: Diagnosis not present

## 2012-12-23 DIAGNOSIS — C61 Malignant neoplasm of prostate: Secondary | ICD-10-CM | POA: Diagnosis not present

## 2012-12-23 LAB — CBC CANCER CENTER
Eosinophil #: 0.1 x10 3/mm (ref 0.0–0.7)
MCHC: 34 g/dL (ref 32.0–36.0)
Monocyte #: 0.5 x10 3/mm (ref 0.2–1.0)
Neutrophil #: 4 x10 3/mm (ref 1.4–6.5)
Neutrophil %: 71.3 %
Platelet: 175 x10 3/mm (ref 150–440)
RDW: 17.4 % — ABNORMAL HIGH (ref 11.5–14.5)
WBC: 5.6 x10 3/mm (ref 3.8–10.6)

## 2012-12-24 DIAGNOSIS — C61 Malignant neoplasm of prostate: Secondary | ICD-10-CM | POA: Diagnosis not present

## 2012-12-27 DIAGNOSIS — C61 Malignant neoplasm of prostate: Secondary | ICD-10-CM | POA: Diagnosis not present

## 2012-12-28 DIAGNOSIS — I2699 Other pulmonary embolism without acute cor pulmonale: Secondary | ICD-10-CM | POA: Diagnosis not present

## 2012-12-28 DIAGNOSIS — E785 Hyperlipidemia, unspecified: Secondary | ICD-10-CM | POA: Diagnosis not present

## 2012-12-28 DIAGNOSIS — J449 Chronic obstructive pulmonary disease, unspecified: Secondary | ICD-10-CM | POA: Diagnosis not present

## 2012-12-28 DIAGNOSIS — C61 Malignant neoplasm of prostate: Secondary | ICD-10-CM | POA: Diagnosis not present

## 2012-12-28 DIAGNOSIS — I10 Essential (primary) hypertension: Secondary | ICD-10-CM | POA: Diagnosis not present

## 2012-12-29 DIAGNOSIS — C61 Malignant neoplasm of prostate: Secondary | ICD-10-CM | POA: Diagnosis not present

## 2012-12-30 DIAGNOSIS — C61 Malignant neoplasm of prostate: Secondary | ICD-10-CM | POA: Diagnosis not present

## 2012-12-30 LAB — CBC CANCER CENTER
Basophil #: 0 x10 3/mm (ref 0.0–0.1)
Eosinophil #: 0.2 x10 3/mm (ref 0.0–0.7)
HGB: 11.6 g/dL — ABNORMAL LOW (ref 13.0–18.0)
Lymphocyte %: 14.7 %
Neutrophil #: 3.7 x10 3/mm (ref 1.4–6.5)
Neutrophil %: 73.2 %
Platelet: 166 x10 3/mm (ref 150–440)
RDW: 17.4 % — ABNORMAL HIGH (ref 11.5–14.5)

## 2012-12-31 DIAGNOSIS — C61 Malignant neoplasm of prostate: Secondary | ICD-10-CM | POA: Diagnosis not present

## 2013-01-03 DIAGNOSIS — C61 Malignant neoplasm of prostate: Secondary | ICD-10-CM | POA: Diagnosis not present

## 2013-01-04 DIAGNOSIS — C61 Malignant neoplasm of prostate: Secondary | ICD-10-CM | POA: Diagnosis not present

## 2013-01-05 DIAGNOSIS — C61 Malignant neoplasm of prostate: Secondary | ICD-10-CM | POA: Diagnosis not present

## 2013-01-06 DIAGNOSIS — C61 Malignant neoplasm of prostate: Secondary | ICD-10-CM | POA: Diagnosis not present

## 2013-01-06 LAB — CBC CANCER CENTER
Lymphocyte #: 0.9 x10 3/mm — ABNORMAL LOW (ref 1.0–3.6)
MCV: 89 fL (ref 80–100)
Neutrophil %: 72.7 %
Platelet: 169 x10 3/mm (ref 150–440)
RDW: 17.4 % — ABNORMAL HIGH (ref 11.5–14.5)
WBC: 5.8 x10 3/mm (ref 3.8–10.6)

## 2013-01-07 DIAGNOSIS — C61 Malignant neoplasm of prostate: Secondary | ICD-10-CM | POA: Diagnosis not present

## 2013-01-10 DIAGNOSIS — C61 Malignant neoplasm of prostate: Secondary | ICD-10-CM | POA: Diagnosis not present

## 2013-01-11 DIAGNOSIS — C61 Malignant neoplasm of prostate: Secondary | ICD-10-CM | POA: Diagnosis not present

## 2013-01-12 DIAGNOSIS — C61 Malignant neoplasm of prostate: Secondary | ICD-10-CM | POA: Diagnosis not present

## 2013-01-13 DIAGNOSIS — C61 Malignant neoplasm of prostate: Secondary | ICD-10-CM | POA: Diagnosis not present

## 2013-01-13 LAB — CBC CANCER CENTER
HGB: 12.2 g/dL — ABNORMAL LOW (ref 13.0–18.0)
Lymphocyte %: 16.7 %
MCH: 30.3 pg (ref 26.0–34.0)
MCV: 90 fL (ref 80–100)
Neutrophil #: 3.9 x10 3/mm (ref 1.4–6.5)
Neutrophil %: 70.6 %
Platelet: 196 x10 3/mm (ref 150–440)
RBC: 4.03 10*6/uL — ABNORMAL LOW (ref 4.40–5.90)
RDW: 17.1 % — ABNORMAL HIGH (ref 11.5–14.5)

## 2013-01-14 ENCOUNTER — Ambulatory Visit: Payer: Self-pay | Admitting: Radiation Oncology

## 2013-01-14 DIAGNOSIS — E7800 Pure hypercholesterolemia, unspecified: Secondary | ICD-10-CM | POA: Diagnosis not present

## 2013-01-14 DIAGNOSIS — C61 Malignant neoplasm of prostate: Secondary | ICD-10-CM | POA: Diagnosis not present

## 2013-01-14 DIAGNOSIS — K219 Gastro-esophageal reflux disease without esophagitis: Secondary | ICD-10-CM | POA: Diagnosis not present

## 2013-01-14 DIAGNOSIS — M810 Age-related osteoporosis without current pathological fracture: Secondary | ICD-10-CM | POA: Diagnosis not present

## 2013-01-14 DIAGNOSIS — E785 Hyperlipidemia, unspecified: Secondary | ICD-10-CM | POA: Diagnosis not present

## 2013-01-14 DIAGNOSIS — Z5181 Encounter for therapeutic drug level monitoring: Secondary | ICD-10-CM | POA: Diagnosis not present

## 2013-01-14 DIAGNOSIS — Z7901 Long term (current) use of anticoagulants: Secondary | ICD-10-CM | POA: Diagnosis not present

## 2013-01-14 DIAGNOSIS — Z86718 Personal history of other venous thrombosis and embolism: Secondary | ICD-10-CM | POA: Diagnosis not present

## 2013-01-14 DIAGNOSIS — I252 Old myocardial infarction: Secondary | ICD-10-CM | POA: Diagnosis not present

## 2013-01-14 DIAGNOSIS — Z79899 Other long term (current) drug therapy: Secondary | ICD-10-CM | POA: Diagnosis not present

## 2013-01-14 DIAGNOSIS — I10 Essential (primary) hypertension: Secondary | ICD-10-CM | POA: Diagnosis not present

## 2013-01-14 DIAGNOSIS — J449 Chronic obstructive pulmonary disease, unspecified: Secondary | ICD-10-CM | POA: Diagnosis not present

## 2013-01-14 DIAGNOSIS — M069 Rheumatoid arthritis, unspecified: Secondary | ICD-10-CM | POA: Diagnosis not present

## 2013-01-14 DIAGNOSIS — Z7982 Long term (current) use of aspirin: Secondary | ICD-10-CM | POA: Diagnosis not present

## 2013-01-17 DIAGNOSIS — C61 Malignant neoplasm of prostate: Secondary | ICD-10-CM | POA: Diagnosis not present

## 2013-01-18 DIAGNOSIS — Z86711 Personal history of pulmonary embolism: Secondary | ICD-10-CM | POA: Diagnosis not present

## 2013-01-25 DIAGNOSIS — M069 Rheumatoid arthritis, unspecified: Secondary | ICD-10-CM | POA: Diagnosis not present

## 2013-02-01 DIAGNOSIS — M810 Age-related osteoporosis without current pathological fracture: Secondary | ICD-10-CM | POA: Diagnosis not present

## 2013-02-01 DIAGNOSIS — M069 Rheumatoid arthritis, unspecified: Secondary | ICD-10-CM | POA: Diagnosis not present

## 2013-02-14 ENCOUNTER — Ambulatory Visit: Payer: Self-pay | Admitting: Radiation Oncology

## 2013-02-15 DIAGNOSIS — Z86711 Personal history of pulmonary embolism: Secondary | ICD-10-CM | POA: Diagnosis not present

## 2013-02-15 DIAGNOSIS — I10 Essential (primary) hypertension: Secondary | ICD-10-CM | POA: Diagnosis not present

## 2013-02-15 DIAGNOSIS — I2699 Other pulmonary embolism without acute cor pulmonale: Secondary | ICD-10-CM | POA: Diagnosis not present

## 2013-02-15 DIAGNOSIS — J449 Chronic obstructive pulmonary disease, unspecified: Secondary | ICD-10-CM | POA: Diagnosis not present

## 2013-03-14 ENCOUNTER — Encounter: Payer: Self-pay | Admitting: *Deleted

## 2013-03-29 DIAGNOSIS — C61 Malignant neoplasm of prostate: Secondary | ICD-10-CM | POA: Diagnosis not present

## 2013-03-29 DIAGNOSIS — I2699 Other pulmonary embolism without acute cor pulmonale: Secondary | ICD-10-CM | POA: Diagnosis not present

## 2013-03-29 DIAGNOSIS — Z86711 Personal history of pulmonary embolism: Secondary | ICD-10-CM | POA: Diagnosis not present

## 2013-03-29 DIAGNOSIS — I10 Essential (primary) hypertension: Secondary | ICD-10-CM | POA: Diagnosis not present

## 2013-04-25 IMAGING — US ULTRASOUND AORTA
1 series · 14 of 18 positions shown · non-contrast
Comparison: none

REASON FOR EXAM: AAA Hx of DVT
COMMENTS:

PROCEDURE:     US  - US AORTA  - May 28, 2012 [DATE]
RESULT:      History: On aortic aneurysm

[Series 1: ultrasound aorta · 0.40mm/px · 18 acquisitions, 14 frames shown]
[im 1/18]
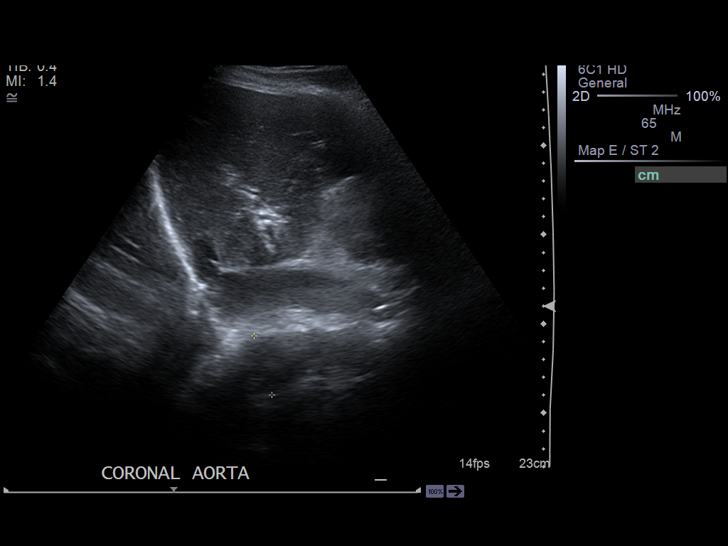
[im 2/18]
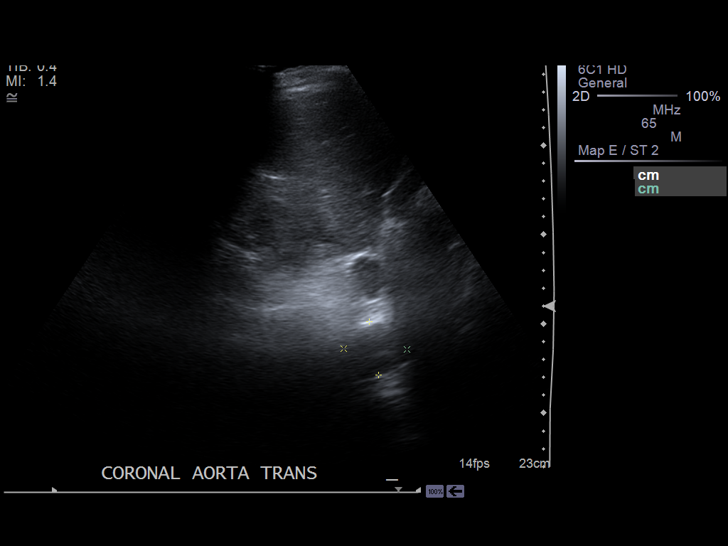
[im 4/18]
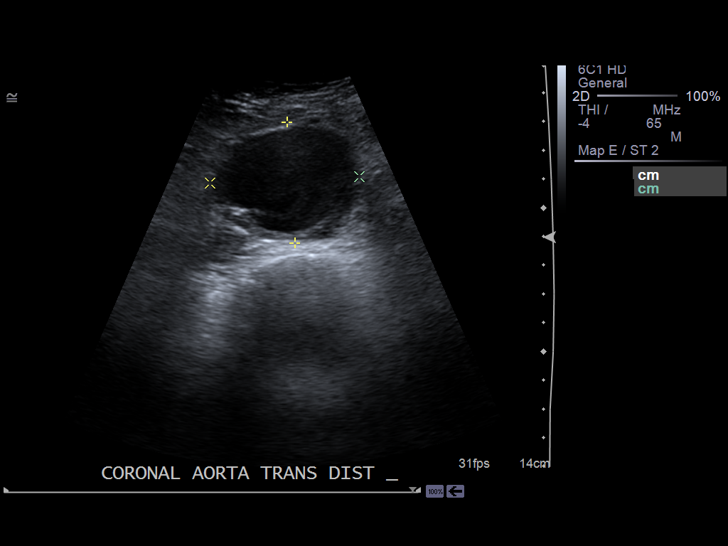
[im 5/18]
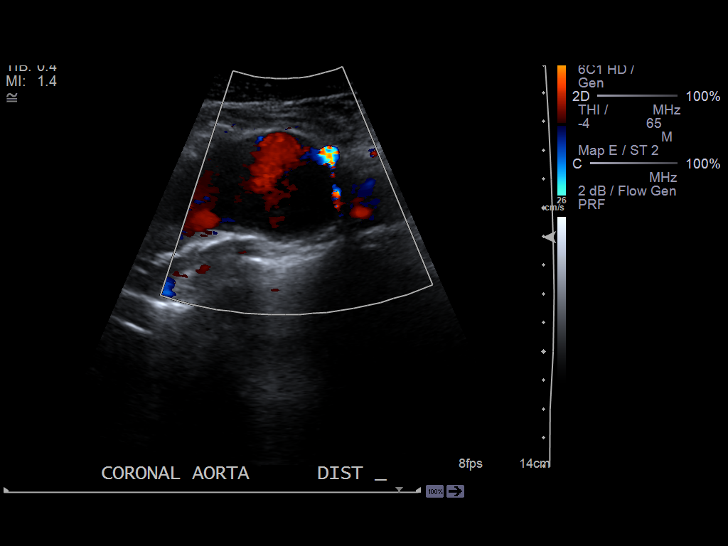
[im 6/18]
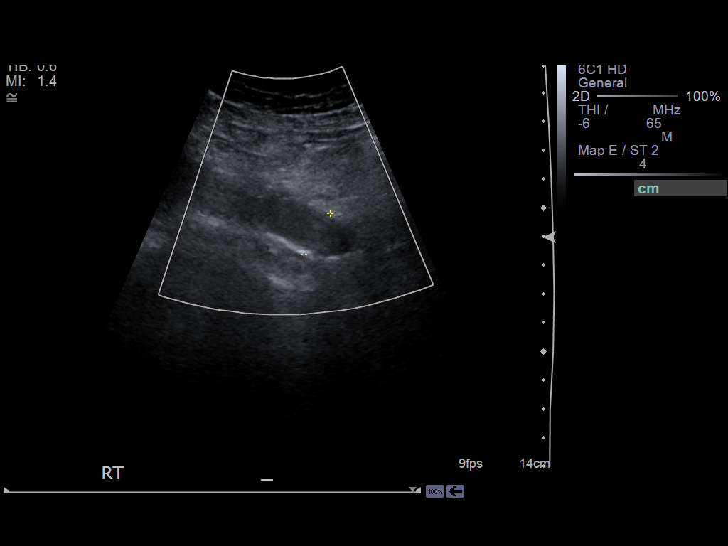
[im 8/18]
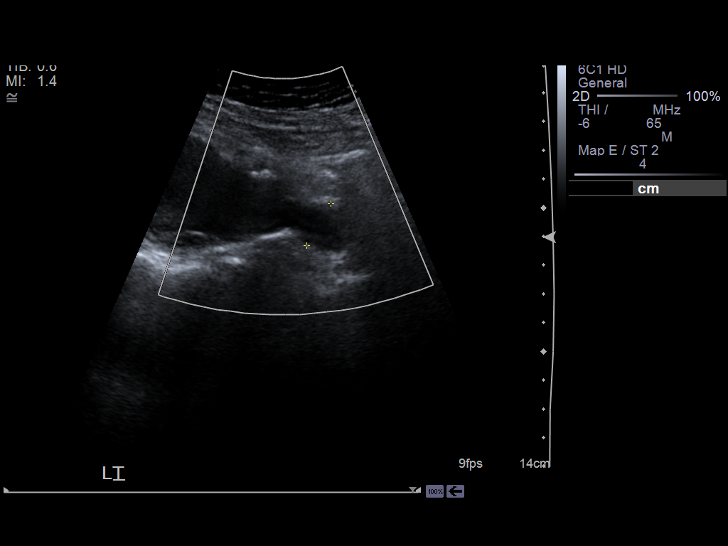
[im 9/18]
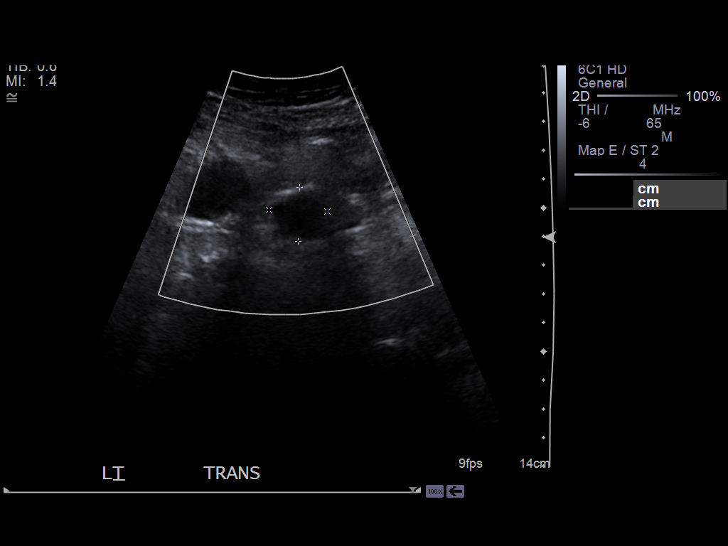
[im 10/18]
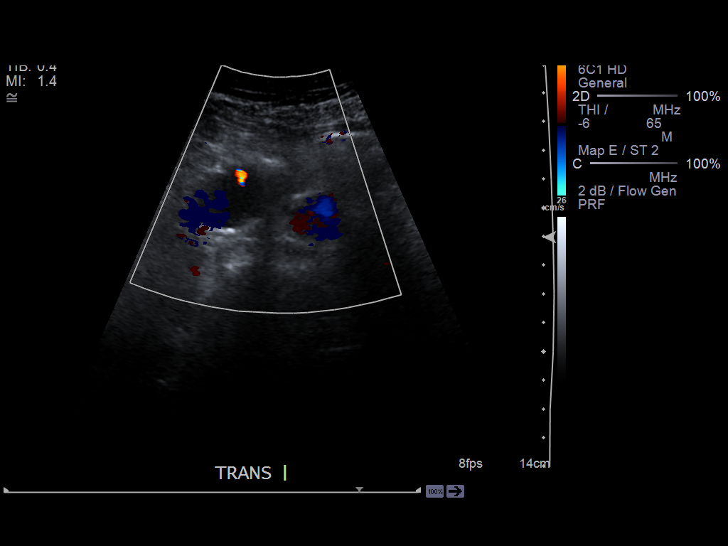
[im 11/18]
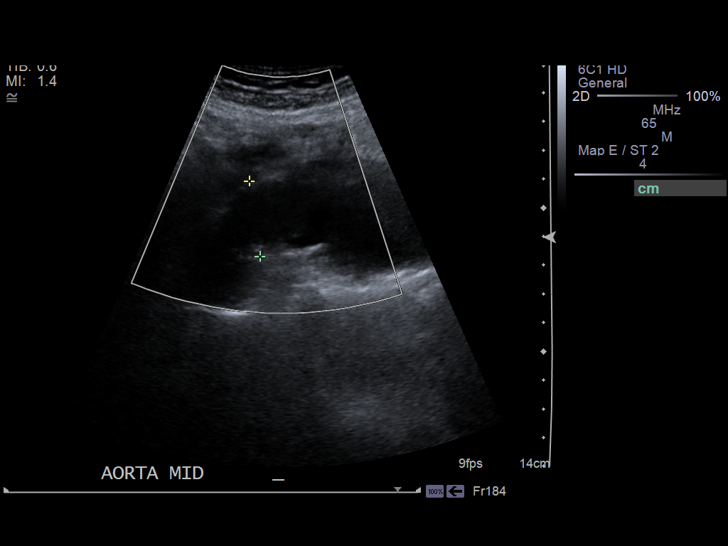
[im 13/18]
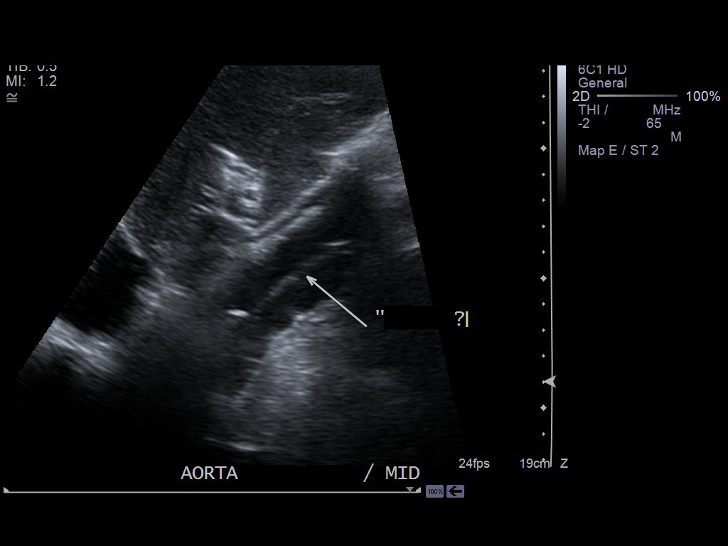
[im 14/18]
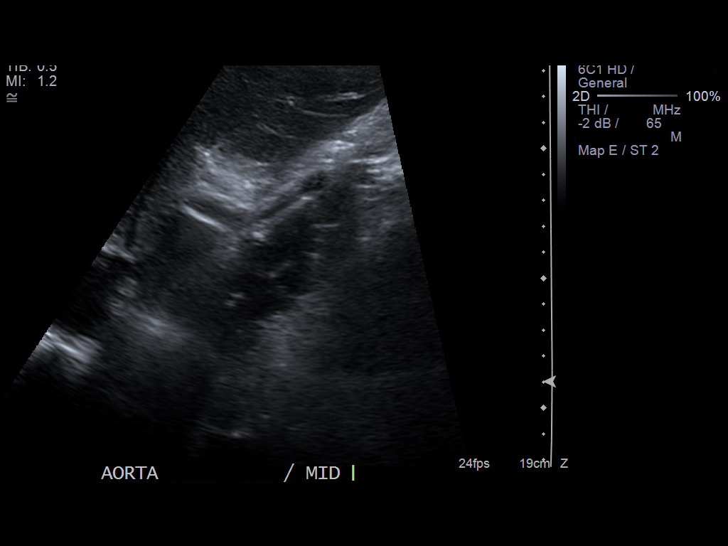
[im 15/18]
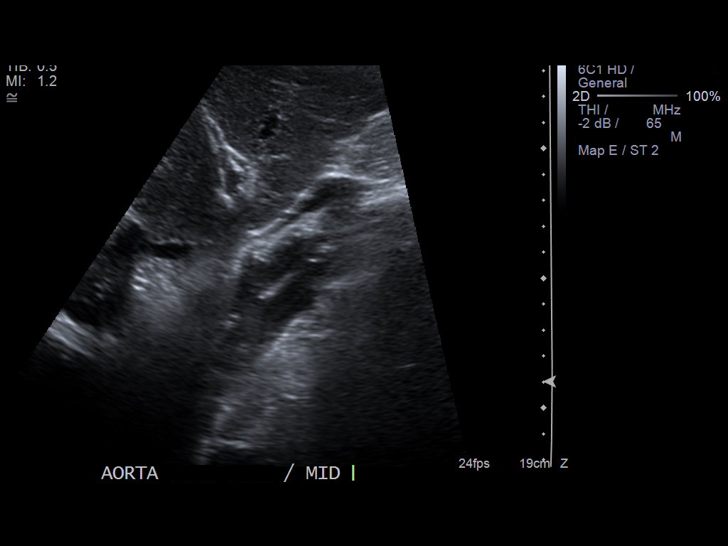
[im 17/18]
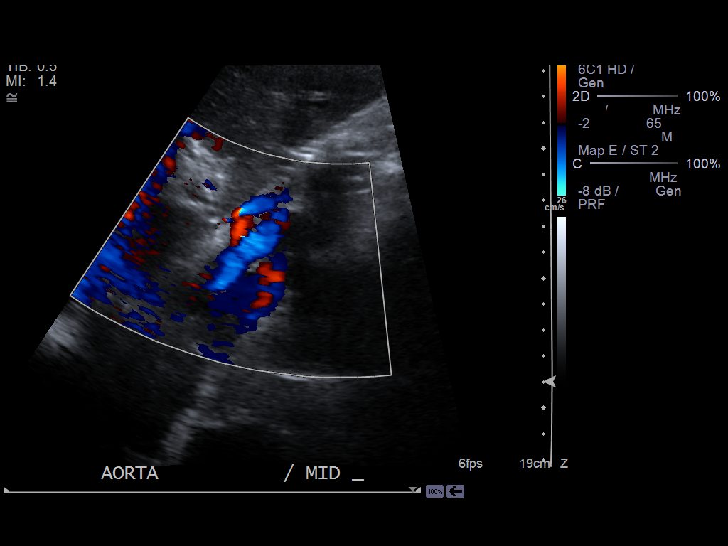
[im 18/18]
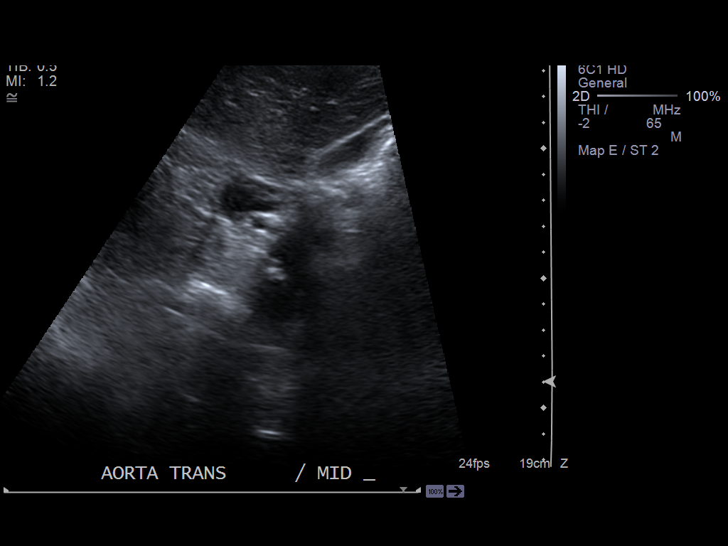

[14 of 18 positions shown; findings below may reference images not displayed]

FINDINGS: Comparison made to prior CT abdomen of 09/03/2009. Again noted is a
dissecting abdominal aortic aneurysm. Maximum diameter is 5.3 cm. This is
increased in size from prior study. Dissection into the iliacs cannot be
entirely excluded. Aneurysmal dilatation of both iliacs, the right to 1.9 cm
the left 1.7 cm noted. Further evaluation with CTA and vascular surgery
consultation suggested.
IMPRESSION: Enlarging dissecting abdominal aortic aneurysm as described
above. Further evaluation with CTA and vascular surgery consultation
suggested.

Report phoned to patient's physician at time of study.

## 2013-04-26 DIAGNOSIS — I10 Essential (primary) hypertension: Secondary | ICD-10-CM | POA: Diagnosis not present

## 2013-04-26 DIAGNOSIS — Z86711 Personal history of pulmonary embolism: Secondary | ICD-10-CM | POA: Diagnosis not present

## 2013-04-26 DIAGNOSIS — I2699 Other pulmonary embolism without acute cor pulmonale: Secondary | ICD-10-CM | POA: Diagnosis not present

## 2013-04-26 DIAGNOSIS — C61 Malignant neoplasm of prostate: Secondary | ICD-10-CM | POA: Diagnosis not present

## 2013-06-17 ENCOUNTER — Ambulatory Visit: Payer: Self-pay | Admitting: Radiation Oncology

## 2013-06-17 DIAGNOSIS — I714 Abdominal aortic aneurysm, without rupture, unspecified: Secondary | ICD-10-CM | POA: Diagnosis not present

## 2013-06-17 DIAGNOSIS — Z09 Encounter for follow-up examination after completed treatment for conditions other than malignant neoplasm: Secondary | ICD-10-CM | POA: Diagnosis not present

## 2013-06-17 DIAGNOSIS — Z923 Personal history of irradiation: Secondary | ICD-10-CM | POA: Diagnosis not present

## 2013-06-17 DIAGNOSIS — Z5181 Encounter for therapeutic drug level monitoring: Secondary | ICD-10-CM | POA: Diagnosis not present

## 2013-06-17 DIAGNOSIS — C61 Malignant neoplasm of prostate: Secondary | ICD-10-CM | POA: Diagnosis not present

## 2013-06-17 DIAGNOSIS — Z7901 Long term (current) use of anticoagulants: Secondary | ICD-10-CM | POA: Diagnosis not present

## 2013-06-20 DIAGNOSIS — Z5181 Encounter for therapeutic drug level monitoring: Secondary | ICD-10-CM | POA: Diagnosis not present

## 2013-06-20 DIAGNOSIS — Z7901 Long term (current) use of anticoagulants: Secondary | ICD-10-CM | POA: Diagnosis not present

## 2013-06-20 DIAGNOSIS — I714 Abdominal aortic aneurysm, without rupture, unspecified: Secondary | ICD-10-CM | POA: Diagnosis not present

## 2013-06-20 DIAGNOSIS — Z923 Personal history of irradiation: Secondary | ICD-10-CM | POA: Diagnosis not present

## 2013-06-20 DIAGNOSIS — C61 Malignant neoplasm of prostate: Secondary | ICD-10-CM | POA: Diagnosis not present

## 2013-06-20 DIAGNOSIS — Z09 Encounter for follow-up examination after completed treatment for conditions other than malignant neoplasm: Secondary | ICD-10-CM | POA: Diagnosis not present

## 2013-06-21 LAB — PSA: PSA: 1 ng/mL

## 2013-06-28 DIAGNOSIS — C61 Malignant neoplasm of prostate: Secondary | ICD-10-CM | POA: Diagnosis not present

## 2013-06-28 DIAGNOSIS — I10 Essential (primary) hypertension: Secondary | ICD-10-CM | POA: Diagnosis not present

## 2013-06-28 DIAGNOSIS — I2699 Other pulmonary embolism without acute cor pulmonale: Secondary | ICD-10-CM | POA: Diagnosis not present

## 2013-06-28 DIAGNOSIS — Z86711 Personal history of pulmonary embolism: Secondary | ICD-10-CM | POA: Diagnosis not present

## 2013-07-17 ENCOUNTER — Ambulatory Visit: Payer: Self-pay | Admitting: Radiation Oncology

## 2013-07-26 DIAGNOSIS — I2699 Other pulmonary embolism without acute cor pulmonale: Secondary | ICD-10-CM | POA: Diagnosis not present

## 2013-07-26 DIAGNOSIS — I10 Essential (primary) hypertension: Secondary | ICD-10-CM | POA: Diagnosis not present

## 2013-07-26 DIAGNOSIS — C61 Malignant neoplasm of prostate: Secondary | ICD-10-CM | POA: Diagnosis not present

## 2013-07-26 DIAGNOSIS — Z79899 Other long term (current) drug therapy: Secondary | ICD-10-CM | POA: Diagnosis not present

## 2013-07-26 DIAGNOSIS — Z86711 Personal history of pulmonary embolism: Secondary | ICD-10-CM | POA: Diagnosis not present

## 2013-07-26 DIAGNOSIS — M069 Rheumatoid arthritis, unspecified: Secondary | ICD-10-CM | POA: Diagnosis not present

## 2013-08-04 DIAGNOSIS — D649 Anemia, unspecified: Secondary | ICD-10-CM | POA: Diagnosis not present

## 2013-08-04 DIAGNOSIS — M069 Rheumatoid arthritis, unspecified: Secondary | ICD-10-CM | POA: Diagnosis not present

## 2013-08-04 DIAGNOSIS — M810 Age-related osteoporosis without current pathological fracture: Secondary | ICD-10-CM | POA: Diagnosis not present

## 2013-08-19 DIAGNOSIS — H26499 Other secondary cataract, unspecified eye: Secondary | ICD-10-CM | POA: Diagnosis not present

## 2013-08-23 DIAGNOSIS — I2699 Other pulmonary embolism without acute cor pulmonale: Secondary | ICD-10-CM | POA: Diagnosis not present

## 2013-08-23 DIAGNOSIS — I10 Essential (primary) hypertension: Secondary | ICD-10-CM | POA: Diagnosis not present

## 2013-08-23 DIAGNOSIS — Z86711 Personal history of pulmonary embolism: Secondary | ICD-10-CM | POA: Diagnosis not present

## 2013-08-23 DIAGNOSIS — C61 Malignant neoplasm of prostate: Secondary | ICD-10-CM | POA: Diagnosis not present

## 2013-08-29 DIAGNOSIS — I7100 Dissection of unspecified site of aorta: Secondary | ICD-10-CM | POA: Diagnosis not present

## 2013-08-29 DIAGNOSIS — J90 Pleural effusion, not elsewhere classified: Secondary | ICD-10-CM | POA: Diagnosis not present

## 2013-08-29 DIAGNOSIS — I71019 Dissection of thoracic aorta, unspecified: Secondary | ICD-10-CM | POA: Diagnosis not present

## 2013-08-29 DIAGNOSIS — J9819 Other pulmonary collapse: Secondary | ICD-10-CM | POA: Diagnosis not present

## 2013-08-29 DIAGNOSIS — J438 Other emphysema: Secondary | ICD-10-CM | POA: Diagnosis not present

## 2013-08-29 DIAGNOSIS — I714 Abdominal aortic aneurysm, without rupture, unspecified: Secondary | ICD-10-CM | POA: Diagnosis not present

## 2013-08-29 DIAGNOSIS — I7101 Dissection of thoracic aorta: Secondary | ICD-10-CM | POA: Diagnosis not present

## 2013-08-29 DIAGNOSIS — Z96649 Presence of unspecified artificial hip joint: Secondary | ICD-10-CM | POA: Diagnosis not present

## 2013-08-29 DIAGNOSIS — I7090 Unspecified atherosclerosis: Secondary | ICD-10-CM | POA: Diagnosis not present

## 2013-09-20 DIAGNOSIS — I10 Essential (primary) hypertension: Secondary | ICD-10-CM | POA: Diagnosis not present

## 2013-09-20 DIAGNOSIS — I2699 Other pulmonary embolism without acute cor pulmonale: Secondary | ICD-10-CM | POA: Diagnosis not present

## 2013-09-20 DIAGNOSIS — Z86711 Personal history of pulmonary embolism: Secondary | ICD-10-CM | POA: Diagnosis not present

## 2013-09-20 DIAGNOSIS — C61 Malignant neoplasm of prostate: Secondary | ICD-10-CM | POA: Diagnosis not present

## 2013-09-22 DIAGNOSIS — I10 Essential (primary) hypertension: Secondary | ICD-10-CM | POA: Diagnosis not present

## 2013-09-22 DIAGNOSIS — C61 Malignant neoplasm of prostate: Secondary | ICD-10-CM | POA: Diagnosis not present

## 2013-09-22 DIAGNOSIS — I7100 Dissection of unspecified site of aorta: Secondary | ICD-10-CM | POA: Diagnosis not present

## 2013-09-22 DIAGNOSIS — I714 Abdominal aortic aneurysm, without rupture, unspecified: Secondary | ICD-10-CM | POA: Diagnosis not present

## 2013-09-22 DIAGNOSIS — M810 Age-related osteoporosis without current pathological fracture: Secondary | ICD-10-CM | POA: Diagnosis not present

## 2013-09-22 DIAGNOSIS — M069 Rheumatoid arthritis, unspecified: Secondary | ICD-10-CM | POA: Diagnosis not present

## 2013-09-22 DIAGNOSIS — I82409 Acute embolism and thrombosis of unspecified deep veins of unspecified lower extremity: Secondary | ICD-10-CM | POA: Diagnosis not present

## 2013-09-22 DIAGNOSIS — J449 Chronic obstructive pulmonary disease, unspecified: Secondary | ICD-10-CM | POA: Diagnosis not present

## 2013-09-30 DIAGNOSIS — I7103 Dissection of thoracoabdominal aorta: Secondary | ICD-10-CM | POA: Diagnosis not present

## 2013-09-30 DIAGNOSIS — I716 Thoracoabdominal aortic aneurysm, without rupture, unspecified: Secondary | ICD-10-CM | POA: Diagnosis not present

## 2013-09-30 DIAGNOSIS — I743 Embolism and thrombosis of arteries of the lower extremities: Secondary | ICD-10-CM | POA: Diagnosis not present

## 2013-09-30 DIAGNOSIS — I359 Nonrheumatic aortic valve disorder, unspecified: Secondary | ICD-10-CM | POA: Diagnosis not present

## 2013-09-30 DIAGNOSIS — I10 Essential (primary) hypertension: Secondary | ICD-10-CM | POA: Diagnosis not present

## 2013-09-30 DIAGNOSIS — E785 Hyperlipidemia, unspecified: Secondary | ICD-10-CM | POA: Diagnosis not present

## 2013-09-30 DIAGNOSIS — I2510 Atherosclerotic heart disease of native coronary artery without angina pectoris: Secondary | ICD-10-CM | POA: Diagnosis not present

## 2013-09-30 DIAGNOSIS — I4891 Unspecified atrial fibrillation: Secondary | ICD-10-CM | POA: Insufficient documentation

## 2013-09-30 DIAGNOSIS — I6529 Occlusion and stenosis of unspecified carotid artery: Secondary | ICD-10-CM | POA: Diagnosis not present

## 2013-09-30 DIAGNOSIS — Z01818 Encounter for other preprocedural examination: Secondary | ICD-10-CM | POA: Diagnosis not present

## 2013-09-30 DIAGNOSIS — Z01810 Encounter for preprocedural cardiovascular examination: Secondary | ICD-10-CM | POA: Diagnosis not present

## 2013-09-30 DIAGNOSIS — J90 Pleural effusion, not elsewhere classified: Secondary | ICD-10-CM | POA: Diagnosis not present

## 2013-09-30 DIAGNOSIS — Z87891 Personal history of nicotine dependence: Secondary | ICD-10-CM | POA: Diagnosis not present

## 2013-09-30 DIAGNOSIS — I517 Cardiomegaly: Secondary | ICD-10-CM | POA: Diagnosis not present

## 2013-09-30 DIAGNOSIS — J9819 Other pulmonary collapse: Secondary | ICD-10-CM | POA: Diagnosis not present

## 2013-10-03 DIAGNOSIS — Z01818 Encounter for other preprocedural examination: Secondary | ICD-10-CM | POA: Diagnosis not present

## 2013-10-20 DIAGNOSIS — I714 Abdominal aortic aneurysm, without rupture, unspecified: Secondary | ICD-10-CM | POA: Diagnosis not present

## 2013-10-20 DIAGNOSIS — J449 Chronic obstructive pulmonary disease, unspecified: Secondary | ICD-10-CM | POA: Diagnosis not present

## 2013-10-25 DIAGNOSIS — Z7982 Long term (current) use of aspirin: Secondary | ICD-10-CM | POA: Diagnosis not present

## 2013-10-25 DIAGNOSIS — I716 Thoracoabdominal aortic aneurysm, without rupture, unspecified: Secondary | ICD-10-CM | POA: Diagnosis not present

## 2013-10-25 DIAGNOSIS — I10 Essential (primary) hypertension: Secondary | ICD-10-CM | POA: Diagnosis not present

## 2013-10-25 DIAGNOSIS — I7103 Dissection of thoracoabdominal aorta: Secondary | ICD-10-CM | POA: Diagnosis not present

## 2013-10-25 DIAGNOSIS — J449 Chronic obstructive pulmonary disease, unspecified: Secondary | ICD-10-CM | POA: Diagnosis not present

## 2013-10-25 DIAGNOSIS — D62 Acute posthemorrhagic anemia: Secondary | ICD-10-CM | POA: Diagnosis not present

## 2013-10-25 DIAGNOSIS — K219 Gastro-esophageal reflux disease without esophagitis: Secondary | ICD-10-CM | POA: Diagnosis present

## 2013-10-25 DIAGNOSIS — Z86718 Personal history of other venous thrombosis and embolism: Secondary | ICD-10-CM | POA: Diagnosis not present

## 2013-10-25 DIAGNOSIS — I4891 Unspecified atrial fibrillation: Secondary | ICD-10-CM | POA: Diagnosis not present

## 2013-10-25 DIAGNOSIS — G8918 Other acute postprocedural pain: Secondary | ICD-10-CM | POA: Diagnosis not present

## 2013-10-25 DIAGNOSIS — Z87891 Personal history of nicotine dependence: Secondary | ICD-10-CM | POA: Diagnosis not present

## 2013-10-25 DIAGNOSIS — Z86711 Personal history of pulmonary embolism: Secondary | ICD-10-CM | POA: Diagnosis not present

## 2013-10-25 DIAGNOSIS — J4489 Other specified chronic obstructive pulmonary disease: Secondary | ICD-10-CM | POA: Diagnosis not present

## 2013-10-25 DIAGNOSIS — I714 Abdominal aortic aneurysm, without rupture, unspecified: Secondary | ICD-10-CM | POA: Diagnosis not present

## 2013-10-25 DIAGNOSIS — M069 Rheumatoid arthritis, unspecified: Secondary | ICD-10-CM | POA: Diagnosis present

## 2013-10-25 DIAGNOSIS — Z8546 Personal history of malignant neoplasm of prostate: Secondary | ICD-10-CM | POA: Diagnosis not present

## 2013-10-25 DIAGNOSIS — E785 Hyperlipidemia, unspecified: Secondary | ICD-10-CM | POA: Diagnosis present

## 2013-10-25 DIAGNOSIS — Z7901 Long term (current) use of anticoagulants: Secondary | ICD-10-CM | POA: Diagnosis not present

## 2013-10-25 DIAGNOSIS — Z79899 Other long term (current) drug therapy: Secondary | ICD-10-CM | POA: Diagnosis not present

## 2013-10-25 DIAGNOSIS — M810 Age-related osteoporosis without current pathological fracture: Secondary | ICD-10-CM | POA: Diagnosis present

## 2013-10-25 DIAGNOSIS — E876 Hypokalemia: Secondary | ICD-10-CM | POA: Diagnosis not present

## 2013-10-26 DIAGNOSIS — I714 Abdominal aortic aneurysm, without rupture: Secondary | ICD-10-CM | POA: Diagnosis not present

## 2013-10-26 DIAGNOSIS — I4891 Unspecified atrial fibrillation: Secondary | ICD-10-CM | POA: Diagnosis not present

## 2013-10-26 DIAGNOSIS — I7103 Dissection of thoracoabdominal aorta: Secondary | ICD-10-CM | POA: Diagnosis not present

## 2013-10-26 DIAGNOSIS — E876 Hypokalemia: Secondary | ICD-10-CM | POA: Diagnosis not present

## 2013-10-26 DIAGNOSIS — J449 Chronic obstructive pulmonary disease, unspecified: Secondary | ICD-10-CM | POA: Diagnosis not present

## 2013-10-26 DIAGNOSIS — I716 Thoracoabdominal aortic aneurysm, without rupture: Secondary | ICD-10-CM | POA: Diagnosis not present

## 2013-10-26 DIAGNOSIS — G8918 Other acute postprocedural pain: Secondary | ICD-10-CM | POA: Diagnosis not present

## 2013-10-27 DIAGNOSIS — I7103 Dissection of thoracoabdominal aorta: Secondary | ICD-10-CM | POA: Diagnosis not present

## 2013-10-27 DIAGNOSIS — I716 Thoracoabdominal aortic aneurysm, without rupture: Secondary | ICD-10-CM | POA: Diagnosis not present

## 2013-10-27 DIAGNOSIS — I10 Essential (primary) hypertension: Secondary | ICD-10-CM | POA: Diagnosis not present

## 2013-10-27 DIAGNOSIS — J449 Chronic obstructive pulmonary disease, unspecified: Secondary | ICD-10-CM | POA: Diagnosis not present

## 2013-10-27 DIAGNOSIS — I714 Abdominal aortic aneurysm, without rupture: Secondary | ICD-10-CM | POA: Diagnosis not present

## 2013-10-27 DIAGNOSIS — I4891 Unspecified atrial fibrillation: Secondary | ICD-10-CM | POA: Diagnosis not present

## 2013-11-02 DIAGNOSIS — Z79899 Other long term (current) drug therapy: Secondary | ICD-10-CM | POA: Diagnosis not present

## 2013-11-02 DIAGNOSIS — M069 Rheumatoid arthritis, unspecified: Secondary | ICD-10-CM | POA: Diagnosis not present

## 2013-12-05 DIAGNOSIS — I4891 Unspecified atrial fibrillation: Secondary | ICD-10-CM | POA: Diagnosis not present

## 2013-12-22 DIAGNOSIS — J438 Other emphysema: Secondary | ICD-10-CM | POA: Diagnosis not present

## 2013-12-22 DIAGNOSIS — R918 Other nonspecific abnormal finding of lung field: Secondary | ICD-10-CM | POA: Diagnosis not present

## 2013-12-22 DIAGNOSIS — J90 Pleural effusion, not elsewhere classified: Secondary | ICD-10-CM | POA: Diagnosis not present

## 2013-12-22 DIAGNOSIS — I749 Embolism and thrombosis of unspecified artery: Secondary | ICD-10-CM | POA: Diagnosis not present

## 2013-12-22 DIAGNOSIS — I70299 Other atherosclerosis of native arteries of extremities, unspecified extremity: Secondary | ICD-10-CM | POA: Diagnosis not present

## 2013-12-22 DIAGNOSIS — Z9889 Other specified postprocedural states: Secondary | ICD-10-CM | POA: Diagnosis not present

## 2013-12-22 DIAGNOSIS — I7103 Dissection of thoracoabdominal aorta: Secondary | ICD-10-CM | POA: Diagnosis not present

## 2013-12-22 DIAGNOSIS — I2510 Atherosclerotic heart disease of native coronary artery without angina pectoris: Secondary | ICD-10-CM | POA: Diagnosis not present

## 2013-12-23 ENCOUNTER — Ambulatory Visit: Payer: Self-pay | Admitting: Radiation Oncology

## 2013-12-23 DIAGNOSIS — C61 Malignant neoplasm of prostate: Secondary | ICD-10-CM | POA: Diagnosis not present

## 2013-12-23 DIAGNOSIS — Z923 Personal history of irradiation: Secondary | ICD-10-CM | POA: Diagnosis not present

## 2013-12-26 DIAGNOSIS — C61 Malignant neoplasm of prostate: Secondary | ICD-10-CM | POA: Diagnosis not present

## 2013-12-27 LAB — PSA: PSA: 0.2 ng/mL (ref 0.0–4.0)

## 2014-01-14 ENCOUNTER — Ambulatory Visit: Payer: Self-pay | Admitting: Radiation Oncology

## 2014-01-22 DIAGNOSIS — J90 Pleural effusion, not elsewhere classified: Secondary | ICD-10-CM | POA: Diagnosis not present

## 2014-01-22 DIAGNOSIS — Z8546 Personal history of malignant neoplasm of prostate: Secondary | ICD-10-CM | POA: Diagnosis not present

## 2014-01-22 DIAGNOSIS — I716 Thoracoabdominal aortic aneurysm, without rupture, unspecified: Secondary | ICD-10-CM | POA: Diagnosis not present

## 2014-01-22 DIAGNOSIS — I7103 Dissection of thoracoabdominal aorta: Secondary | ICD-10-CM | POA: Diagnosis present

## 2014-01-22 DIAGNOSIS — I10 Essential (primary) hypertension: Secondary | ICD-10-CM | POA: Diagnosis not present

## 2014-01-22 DIAGNOSIS — J9819 Other pulmonary collapse: Secondary | ICD-10-CM | POA: Diagnosis not present

## 2014-01-22 DIAGNOSIS — M069 Rheumatoid arthritis, unspecified: Secondary | ICD-10-CM | POA: Diagnosis present

## 2014-01-22 DIAGNOSIS — I714 Abdominal aortic aneurysm, without rupture, unspecified: Secondary | ICD-10-CM | POA: Diagnosis not present

## 2014-01-22 DIAGNOSIS — Z48812 Encounter for surgical aftercare following surgery on the circulatory system: Secondary | ICD-10-CM | POA: Diagnosis not present

## 2014-01-22 DIAGNOSIS — J449 Chronic obstructive pulmonary disease, unspecified: Secondary | ICD-10-CM | POA: Diagnosis not present

## 2014-01-22 DIAGNOSIS — I82409 Acute embolism and thrombosis of unspecified deep veins of unspecified lower extremity: Secondary | ICD-10-CM | POA: Diagnosis not present

## 2014-01-22 DIAGNOSIS — Z66 Do not resuscitate: Secondary | ICD-10-CM | POA: Diagnosis present

## 2014-01-22 DIAGNOSIS — Z01810 Encounter for preprocedural cardiovascular examination: Secondary | ICD-10-CM | POA: Diagnosis not present

## 2014-01-22 DIAGNOSIS — I4891 Unspecified atrial fibrillation: Secondary | ICD-10-CM | POA: Diagnosis not present

## 2014-01-22 DIAGNOSIS — J4489 Other specified chronic obstructive pulmonary disease: Secondary | ICD-10-CM | POA: Diagnosis not present

## 2014-01-22 DIAGNOSIS — G8918 Other acute postprocedural pain: Secondary | ICD-10-CM | POA: Diagnosis not present

## 2014-01-22 DIAGNOSIS — Z86711 Personal history of pulmonary embolism: Secondary | ICD-10-CM | POA: Diagnosis not present

## 2014-01-22 DIAGNOSIS — Z01818 Encounter for other preprocedural examination: Secondary | ICD-10-CM | POA: Diagnosis not present

## 2014-01-22 DIAGNOSIS — I743 Embolism and thrombosis of arteries of the lower extremities: Secondary | ICD-10-CM | POA: Diagnosis not present

## 2014-01-22 DIAGNOSIS — I7100 Dissection of unspecified site of aorta: Secondary | ICD-10-CM | POA: Diagnosis not present

## 2014-01-22 DIAGNOSIS — Z87891 Personal history of nicotine dependence: Secondary | ICD-10-CM | POA: Diagnosis not present

## 2014-01-22 DIAGNOSIS — Z86718 Personal history of other venous thrombosis and embolism: Secondary | ICD-10-CM | POA: Diagnosis not present

## 2014-01-22 DIAGNOSIS — D62 Acute posthemorrhagic anemia: Secondary | ICD-10-CM | POA: Diagnosis not present

## 2014-01-22 DIAGNOSIS — M810 Age-related osteoporosis without current pathological fracture: Secondary | ICD-10-CM | POA: Diagnosis present

## 2014-01-31 DIAGNOSIS — Z48812 Encounter for surgical aftercare following surgery on the circulatory system: Secondary | ICD-10-CM | POA: Diagnosis not present

## 2014-01-31 DIAGNOSIS — I2699 Other pulmonary embolism without acute cor pulmonale: Secondary | ICD-10-CM | POA: Diagnosis not present

## 2014-01-31 DIAGNOSIS — J449 Chronic obstructive pulmonary disease, unspecified: Secondary | ICD-10-CM | POA: Diagnosis not present

## 2014-01-31 DIAGNOSIS — I714 Abdominal aortic aneurysm, without rupture, unspecified: Secondary | ICD-10-CM | POA: Diagnosis not present

## 2014-01-31 DIAGNOSIS — M069 Rheumatoid arthritis, unspecified: Secondary | ICD-10-CM | POA: Diagnosis not present

## 2014-01-31 DIAGNOSIS — M810 Age-related osteoporosis without current pathological fracture: Secondary | ICD-10-CM | POA: Diagnosis not present

## 2014-01-31 DIAGNOSIS — I4891 Unspecified atrial fibrillation: Secondary | ICD-10-CM | POA: Diagnosis not present

## 2014-01-31 DIAGNOSIS — I10 Essential (primary) hypertension: Secondary | ICD-10-CM | POA: Diagnosis not present

## 2014-01-31 DIAGNOSIS — Z86711 Personal history of pulmonary embolism: Secondary | ICD-10-CM | POA: Diagnosis not present

## 2014-02-02 DIAGNOSIS — I10 Essential (primary) hypertension: Secondary | ICD-10-CM | POA: Diagnosis not present

## 2014-02-02 DIAGNOSIS — I4891 Unspecified atrial fibrillation: Secondary | ICD-10-CM | POA: Diagnosis not present

## 2014-02-02 DIAGNOSIS — M069 Rheumatoid arthritis, unspecified: Secondary | ICD-10-CM | POA: Diagnosis not present

## 2014-02-02 DIAGNOSIS — J449 Chronic obstructive pulmonary disease, unspecified: Secondary | ICD-10-CM | POA: Diagnosis not present

## 2014-02-02 DIAGNOSIS — M810 Age-related osteoporosis without current pathological fracture: Secondary | ICD-10-CM | POA: Diagnosis not present

## 2014-02-02 DIAGNOSIS — Z48812 Encounter for surgical aftercare following surgery on the circulatory system: Secondary | ICD-10-CM | POA: Diagnosis not present

## 2014-02-03 DIAGNOSIS — Z48812 Encounter for surgical aftercare following surgery on the circulatory system: Secondary | ICD-10-CM | POA: Diagnosis not present

## 2014-02-03 DIAGNOSIS — M069 Rheumatoid arthritis, unspecified: Secondary | ICD-10-CM | POA: Diagnosis not present

## 2014-02-03 DIAGNOSIS — M810 Age-related osteoporosis without current pathological fracture: Secondary | ICD-10-CM | POA: Diagnosis not present

## 2014-02-03 DIAGNOSIS — J449 Chronic obstructive pulmonary disease, unspecified: Secondary | ICD-10-CM | POA: Diagnosis not present

## 2014-02-03 DIAGNOSIS — I4891 Unspecified atrial fibrillation: Secondary | ICD-10-CM | POA: Diagnosis not present

## 2014-02-03 DIAGNOSIS — I10 Essential (primary) hypertension: Secondary | ICD-10-CM | POA: Diagnosis not present

## 2014-02-06 DIAGNOSIS — J449 Chronic obstructive pulmonary disease, unspecified: Secondary | ICD-10-CM | POA: Diagnosis not present

## 2014-02-06 DIAGNOSIS — I10 Essential (primary) hypertension: Secondary | ICD-10-CM | POA: Diagnosis not present

## 2014-02-06 DIAGNOSIS — Z48812 Encounter for surgical aftercare following surgery on the circulatory system: Secondary | ICD-10-CM | POA: Diagnosis not present

## 2014-02-06 DIAGNOSIS — M810 Age-related osteoporosis without current pathological fracture: Secondary | ICD-10-CM | POA: Diagnosis not present

## 2014-02-06 DIAGNOSIS — M069 Rheumatoid arthritis, unspecified: Secondary | ICD-10-CM | POA: Diagnosis not present

## 2014-02-06 DIAGNOSIS — I4891 Unspecified atrial fibrillation: Secondary | ICD-10-CM | POA: Diagnosis not present

## 2014-02-07 DIAGNOSIS — I10 Essential (primary) hypertension: Secondary | ICD-10-CM | POA: Diagnosis not present

## 2014-02-07 DIAGNOSIS — Z48812 Encounter for surgical aftercare following surgery on the circulatory system: Secondary | ICD-10-CM | POA: Diagnosis not present

## 2014-02-07 DIAGNOSIS — I4891 Unspecified atrial fibrillation: Secondary | ICD-10-CM | POA: Diagnosis not present

## 2014-02-07 DIAGNOSIS — M069 Rheumatoid arthritis, unspecified: Secondary | ICD-10-CM | POA: Diagnosis not present

## 2014-02-07 DIAGNOSIS — J449 Chronic obstructive pulmonary disease, unspecified: Secondary | ICD-10-CM | POA: Diagnosis not present

## 2014-02-07 DIAGNOSIS — M810 Age-related osteoporosis without current pathological fracture: Secondary | ICD-10-CM | POA: Diagnosis not present

## 2014-02-09 DIAGNOSIS — J449 Chronic obstructive pulmonary disease, unspecified: Secondary | ICD-10-CM | POA: Diagnosis not present

## 2014-02-09 DIAGNOSIS — M810 Age-related osteoporosis without current pathological fracture: Secondary | ICD-10-CM | POA: Diagnosis not present

## 2014-02-09 DIAGNOSIS — Z79899 Other long term (current) drug therapy: Secondary | ICD-10-CM | POA: Diagnosis not present

## 2014-02-09 DIAGNOSIS — I4891 Unspecified atrial fibrillation: Secondary | ICD-10-CM | POA: Diagnosis not present

## 2014-02-09 DIAGNOSIS — I10 Essential (primary) hypertension: Secondary | ICD-10-CM | POA: Diagnosis not present

## 2014-02-09 DIAGNOSIS — Z48812 Encounter for surgical aftercare following surgery on the circulatory system: Secondary | ICD-10-CM | POA: Diagnosis not present

## 2014-02-09 DIAGNOSIS — M069 Rheumatoid arthritis, unspecified: Secondary | ICD-10-CM | POA: Diagnosis not present

## 2014-02-10 DIAGNOSIS — I10 Essential (primary) hypertension: Secondary | ICD-10-CM | POA: Diagnosis not present

## 2014-02-10 DIAGNOSIS — J449 Chronic obstructive pulmonary disease, unspecified: Secondary | ICD-10-CM | POA: Diagnosis not present

## 2014-02-10 DIAGNOSIS — I4891 Unspecified atrial fibrillation: Secondary | ICD-10-CM | POA: Diagnosis not present

## 2014-02-10 DIAGNOSIS — M810 Age-related osteoporosis without current pathological fracture: Secondary | ICD-10-CM | POA: Diagnosis not present

## 2014-02-10 DIAGNOSIS — M069 Rheumatoid arthritis, unspecified: Secondary | ICD-10-CM | POA: Diagnosis not present

## 2014-02-10 DIAGNOSIS — Z48812 Encounter for surgical aftercare following surgery on the circulatory system: Secondary | ICD-10-CM | POA: Diagnosis not present

## 2014-02-13 DIAGNOSIS — I10 Essential (primary) hypertension: Secondary | ICD-10-CM | POA: Diagnosis not present

## 2014-02-13 DIAGNOSIS — J449 Chronic obstructive pulmonary disease, unspecified: Secondary | ICD-10-CM | POA: Diagnosis not present

## 2014-02-13 DIAGNOSIS — M810 Age-related osteoporosis without current pathological fracture: Secondary | ICD-10-CM | POA: Diagnosis not present

## 2014-02-13 DIAGNOSIS — M069 Rheumatoid arthritis, unspecified: Secondary | ICD-10-CM | POA: Diagnosis not present

## 2014-02-13 DIAGNOSIS — Z48812 Encounter for surgical aftercare following surgery on the circulatory system: Secondary | ICD-10-CM | POA: Diagnosis not present

## 2014-02-13 DIAGNOSIS — I4891 Unspecified atrial fibrillation: Secondary | ICD-10-CM | POA: Diagnosis not present

## 2014-02-14 DIAGNOSIS — M810 Age-related osteoporosis without current pathological fracture: Secondary | ICD-10-CM | POA: Insufficient documentation

## 2014-02-14 DIAGNOSIS — J449 Chronic obstructive pulmonary disease, unspecified: Secondary | ICD-10-CM | POA: Diagnosis not present

## 2014-02-14 DIAGNOSIS — I4891 Unspecified atrial fibrillation: Secondary | ICD-10-CM | POA: Diagnosis not present

## 2014-02-14 DIAGNOSIS — I10 Essential (primary) hypertension: Secondary | ICD-10-CM | POA: Diagnosis not present

## 2014-02-14 DIAGNOSIS — M069 Rheumatoid arthritis, unspecified: Secondary | ICD-10-CM | POA: Diagnosis not present

## 2014-02-14 DIAGNOSIS — Z79899 Other long term (current) drug therapy: Secondary | ICD-10-CM | POA: Diagnosis not present

## 2014-02-14 DIAGNOSIS — Z48812 Encounter for surgical aftercare following surgery on the circulatory system: Secondary | ICD-10-CM | POA: Diagnosis not present

## 2014-02-16 DIAGNOSIS — I10 Essential (primary) hypertension: Secondary | ICD-10-CM | POA: Diagnosis not present

## 2014-02-16 DIAGNOSIS — M069 Rheumatoid arthritis, unspecified: Secondary | ICD-10-CM | POA: Diagnosis not present

## 2014-02-16 DIAGNOSIS — I4891 Unspecified atrial fibrillation: Secondary | ICD-10-CM | POA: Diagnosis not present

## 2014-02-16 DIAGNOSIS — J449 Chronic obstructive pulmonary disease, unspecified: Secondary | ICD-10-CM | POA: Diagnosis not present

## 2014-02-16 DIAGNOSIS — M810 Age-related osteoporosis without current pathological fracture: Secondary | ICD-10-CM | POA: Diagnosis not present

## 2014-02-16 DIAGNOSIS — Z48812 Encounter for surgical aftercare following surgery on the circulatory system: Secondary | ICD-10-CM | POA: Diagnosis not present

## 2014-02-17 DIAGNOSIS — J449 Chronic obstructive pulmonary disease, unspecified: Secondary | ICD-10-CM | POA: Diagnosis not present

## 2014-02-17 DIAGNOSIS — M069 Rheumatoid arthritis, unspecified: Secondary | ICD-10-CM | POA: Diagnosis not present

## 2014-02-17 DIAGNOSIS — Z48812 Encounter for surgical aftercare following surgery on the circulatory system: Secondary | ICD-10-CM | POA: Diagnosis not present

## 2014-02-17 DIAGNOSIS — I4891 Unspecified atrial fibrillation: Secondary | ICD-10-CM | POA: Diagnosis not present

## 2014-02-17 DIAGNOSIS — M810 Age-related osteoporosis without current pathological fracture: Secondary | ICD-10-CM | POA: Diagnosis not present

## 2014-02-17 DIAGNOSIS — I10 Essential (primary) hypertension: Secondary | ICD-10-CM | POA: Diagnosis not present

## 2014-02-21 DIAGNOSIS — I10 Essential (primary) hypertension: Secondary | ICD-10-CM | POA: Diagnosis not present

## 2014-02-21 DIAGNOSIS — Z48812 Encounter for surgical aftercare following surgery on the circulatory system: Secondary | ICD-10-CM | POA: Diagnosis not present

## 2014-02-21 DIAGNOSIS — I4891 Unspecified atrial fibrillation: Secondary | ICD-10-CM | POA: Diagnosis not present

## 2014-02-21 DIAGNOSIS — J449 Chronic obstructive pulmonary disease, unspecified: Secondary | ICD-10-CM | POA: Diagnosis not present

## 2014-02-21 DIAGNOSIS — M810 Age-related osteoporosis without current pathological fracture: Secondary | ICD-10-CM | POA: Diagnosis not present

## 2014-02-21 DIAGNOSIS — M069 Rheumatoid arthritis, unspecified: Secondary | ICD-10-CM | POA: Diagnosis not present

## 2014-02-22 DIAGNOSIS — I10 Essential (primary) hypertension: Secondary | ICD-10-CM | POA: Diagnosis not present

## 2014-02-22 DIAGNOSIS — Z48812 Encounter for surgical aftercare following surgery on the circulatory system: Secondary | ICD-10-CM | POA: Diagnosis not present

## 2014-02-22 DIAGNOSIS — M810 Age-related osteoporosis without current pathological fracture: Secondary | ICD-10-CM | POA: Diagnosis not present

## 2014-02-22 DIAGNOSIS — I4891 Unspecified atrial fibrillation: Secondary | ICD-10-CM | POA: Diagnosis not present

## 2014-02-22 DIAGNOSIS — J449 Chronic obstructive pulmonary disease, unspecified: Secondary | ICD-10-CM | POA: Diagnosis not present

## 2014-02-22 DIAGNOSIS — M069 Rheumatoid arthritis, unspecified: Secondary | ICD-10-CM | POA: Diagnosis not present

## 2014-02-24 DIAGNOSIS — I10 Essential (primary) hypertension: Secondary | ICD-10-CM | POA: Diagnosis not present

## 2014-02-24 DIAGNOSIS — M069 Rheumatoid arthritis, unspecified: Secondary | ICD-10-CM | POA: Diagnosis not present

## 2014-02-24 DIAGNOSIS — Z48812 Encounter for surgical aftercare following surgery on the circulatory system: Secondary | ICD-10-CM | POA: Diagnosis not present

## 2014-02-24 DIAGNOSIS — J449 Chronic obstructive pulmonary disease, unspecified: Secondary | ICD-10-CM | POA: Diagnosis not present

## 2014-02-24 DIAGNOSIS — I4891 Unspecified atrial fibrillation: Secondary | ICD-10-CM | POA: Diagnosis not present

## 2014-02-24 DIAGNOSIS — M810 Age-related osteoporosis without current pathological fracture: Secondary | ICD-10-CM | POA: Diagnosis not present

## 2014-02-27 DIAGNOSIS — Z48812 Encounter for surgical aftercare following surgery on the circulatory system: Secondary | ICD-10-CM | POA: Diagnosis not present

## 2014-02-27 DIAGNOSIS — M810 Age-related osteoporosis without current pathological fracture: Secondary | ICD-10-CM | POA: Diagnosis not present

## 2014-02-27 DIAGNOSIS — I10 Essential (primary) hypertension: Secondary | ICD-10-CM | POA: Diagnosis not present

## 2014-02-27 DIAGNOSIS — J449 Chronic obstructive pulmonary disease, unspecified: Secondary | ICD-10-CM | POA: Diagnosis not present

## 2014-02-27 DIAGNOSIS — I4891 Unspecified atrial fibrillation: Secondary | ICD-10-CM | POA: Diagnosis not present

## 2014-02-27 DIAGNOSIS — M069 Rheumatoid arthritis, unspecified: Secondary | ICD-10-CM | POA: Diagnosis not present

## 2014-03-01 DIAGNOSIS — M810 Age-related osteoporosis without current pathological fracture: Secondary | ICD-10-CM | POA: Diagnosis not present

## 2014-03-01 DIAGNOSIS — I4891 Unspecified atrial fibrillation: Secondary | ICD-10-CM | POA: Diagnosis not present

## 2014-03-01 DIAGNOSIS — Z48812 Encounter for surgical aftercare following surgery on the circulatory system: Secondary | ICD-10-CM | POA: Diagnosis not present

## 2014-03-01 DIAGNOSIS — I10 Essential (primary) hypertension: Secondary | ICD-10-CM | POA: Diagnosis not present

## 2014-03-01 DIAGNOSIS — M069 Rheumatoid arthritis, unspecified: Secondary | ICD-10-CM | POA: Diagnosis not present

## 2014-03-01 DIAGNOSIS — J449 Chronic obstructive pulmonary disease, unspecified: Secondary | ICD-10-CM | POA: Diagnosis not present

## 2014-03-02 DIAGNOSIS — Z96649 Presence of unspecified artificial hip joint: Secondary | ICD-10-CM | POA: Diagnosis not present

## 2014-03-02 DIAGNOSIS — I70209 Unspecified atherosclerosis of native arteries of extremities, unspecified extremity: Secondary | ICD-10-CM | POA: Diagnosis not present

## 2014-03-02 DIAGNOSIS — J90 Pleural effusion, not elsewhere classified: Secondary | ICD-10-CM | POA: Diagnosis not present

## 2014-03-02 DIAGNOSIS — I716 Thoracoabdominal aortic aneurysm, without rupture, unspecified: Secondary | ICD-10-CM | POA: Diagnosis not present

## 2014-03-07 DIAGNOSIS — I10 Essential (primary) hypertension: Secondary | ICD-10-CM | POA: Diagnosis not present

## 2014-03-07 DIAGNOSIS — M069 Rheumatoid arthritis, unspecified: Secondary | ICD-10-CM | POA: Diagnosis not present

## 2014-03-07 DIAGNOSIS — I4891 Unspecified atrial fibrillation: Secondary | ICD-10-CM | POA: Diagnosis not present

## 2014-03-07 DIAGNOSIS — Z48812 Encounter for surgical aftercare following surgery on the circulatory system: Secondary | ICD-10-CM | POA: Diagnosis not present

## 2014-03-07 DIAGNOSIS — M810 Age-related osteoporosis without current pathological fracture: Secondary | ICD-10-CM | POA: Diagnosis not present

## 2014-03-07 DIAGNOSIS — J449 Chronic obstructive pulmonary disease, unspecified: Secondary | ICD-10-CM | POA: Diagnosis not present

## 2014-03-08 DIAGNOSIS — M069 Rheumatoid arthritis, unspecified: Secondary | ICD-10-CM | POA: Diagnosis not present

## 2014-03-08 DIAGNOSIS — M810 Age-related osteoporosis without current pathological fracture: Secondary | ICD-10-CM | POA: Diagnosis not present

## 2014-03-08 DIAGNOSIS — I10 Essential (primary) hypertension: Secondary | ICD-10-CM | POA: Diagnosis not present

## 2014-03-08 DIAGNOSIS — I4891 Unspecified atrial fibrillation: Secondary | ICD-10-CM | POA: Diagnosis not present

## 2014-03-08 DIAGNOSIS — Z48812 Encounter for surgical aftercare following surgery on the circulatory system: Secondary | ICD-10-CM | POA: Diagnosis not present

## 2014-03-08 DIAGNOSIS — J449 Chronic obstructive pulmonary disease, unspecified: Secondary | ICD-10-CM | POA: Diagnosis not present

## 2014-03-09 DIAGNOSIS — Z48812 Encounter for surgical aftercare following surgery on the circulatory system: Secondary | ICD-10-CM | POA: Diagnosis not present

## 2014-03-09 DIAGNOSIS — I4891 Unspecified atrial fibrillation: Secondary | ICD-10-CM | POA: Diagnosis not present

## 2014-03-09 DIAGNOSIS — M069 Rheumatoid arthritis, unspecified: Secondary | ICD-10-CM | POA: Diagnosis not present

## 2014-03-09 DIAGNOSIS — I10 Essential (primary) hypertension: Secondary | ICD-10-CM | POA: Diagnosis not present

## 2014-03-09 DIAGNOSIS — M810 Age-related osteoporosis without current pathological fracture: Secondary | ICD-10-CM | POA: Diagnosis not present

## 2014-03-09 DIAGNOSIS — J449 Chronic obstructive pulmonary disease, unspecified: Secondary | ICD-10-CM | POA: Diagnosis not present

## 2014-03-13 DIAGNOSIS — M810 Age-related osteoporosis without current pathological fracture: Secondary | ICD-10-CM | POA: Diagnosis not present

## 2014-03-13 DIAGNOSIS — J449 Chronic obstructive pulmonary disease, unspecified: Secondary | ICD-10-CM | POA: Diagnosis not present

## 2014-03-13 DIAGNOSIS — I714 Abdominal aortic aneurysm, without rupture, unspecified: Secondary | ICD-10-CM | POA: Diagnosis not present

## 2014-03-13 DIAGNOSIS — R0789 Other chest pain: Secondary | ICD-10-CM | POA: Diagnosis not present

## 2014-03-13 DIAGNOSIS — M069 Rheumatoid arthritis, unspecified: Secondary | ICD-10-CM | POA: Diagnosis not present

## 2014-03-13 DIAGNOSIS — I10 Essential (primary) hypertension: Secondary | ICD-10-CM | POA: Diagnosis not present

## 2014-03-13 DIAGNOSIS — I4891 Unspecified atrial fibrillation: Secondary | ICD-10-CM | POA: Diagnosis not present

## 2014-03-14 DIAGNOSIS — Z48812 Encounter for surgical aftercare following surgery on the circulatory system: Secondary | ICD-10-CM | POA: Diagnosis not present

## 2014-03-14 DIAGNOSIS — M069 Rheumatoid arthritis, unspecified: Secondary | ICD-10-CM | POA: Diagnosis not present

## 2014-03-14 DIAGNOSIS — I4891 Unspecified atrial fibrillation: Secondary | ICD-10-CM | POA: Diagnosis not present

## 2014-03-14 DIAGNOSIS — I10 Essential (primary) hypertension: Secondary | ICD-10-CM | POA: Diagnosis not present

## 2014-03-14 DIAGNOSIS — M810 Age-related osteoporosis without current pathological fracture: Secondary | ICD-10-CM | POA: Diagnosis not present

## 2014-03-14 DIAGNOSIS — J449 Chronic obstructive pulmonary disease, unspecified: Secondary | ICD-10-CM | POA: Diagnosis not present

## 2014-03-16 DIAGNOSIS — J449 Chronic obstructive pulmonary disease, unspecified: Secondary | ICD-10-CM | POA: Diagnosis not present

## 2014-03-16 DIAGNOSIS — I4891 Unspecified atrial fibrillation: Secondary | ICD-10-CM | POA: Diagnosis not present

## 2014-03-16 DIAGNOSIS — M810 Age-related osteoporosis without current pathological fracture: Secondary | ICD-10-CM | POA: Diagnosis not present

## 2014-03-16 DIAGNOSIS — Z48812 Encounter for surgical aftercare following surgery on the circulatory system: Secondary | ICD-10-CM | POA: Diagnosis not present

## 2014-03-16 DIAGNOSIS — M069 Rheumatoid arthritis, unspecified: Secondary | ICD-10-CM | POA: Diagnosis not present

## 2014-03-16 DIAGNOSIS — I10 Essential (primary) hypertension: Secondary | ICD-10-CM | POA: Diagnosis not present

## 2014-03-20 DIAGNOSIS — D649 Anemia, unspecified: Secondary | ICD-10-CM | POA: Insufficient documentation

## 2014-03-20 DIAGNOSIS — M069 Rheumatoid arthritis, unspecified: Secondary | ICD-10-CM | POA: Diagnosis present

## 2014-03-20 DIAGNOSIS — Z885 Allergy status to narcotic agent status: Secondary | ICD-10-CM | POA: Diagnosis not present

## 2014-03-20 DIAGNOSIS — J45909 Unspecified asthma, uncomplicated: Secondary | ICD-10-CM | POA: Insufficient documentation

## 2014-03-20 DIAGNOSIS — J449 Chronic obstructive pulmonary disease, unspecified: Secondary | ICD-10-CM | POA: Insufficient documentation

## 2014-03-20 DIAGNOSIS — I4891 Unspecified atrial fibrillation: Secondary | ICD-10-CM | POA: Diagnosis not present

## 2014-03-20 DIAGNOSIS — I10 Essential (primary) hypertension: Secondary | ICD-10-CM | POA: Diagnosis not present

## 2014-03-20 DIAGNOSIS — Z8546 Personal history of malignant neoplasm of prostate: Secondary | ICD-10-CM | POA: Diagnosis not present

## 2014-03-20 DIAGNOSIS — M810 Age-related osteoporosis without current pathological fracture: Secondary | ICD-10-CM | POA: Diagnosis present

## 2014-03-20 DIAGNOSIS — Z86711 Personal history of pulmonary embolism: Secondary | ICD-10-CM | POA: Diagnosis not present

## 2014-03-20 DIAGNOSIS — Z86718 Personal history of other venous thrombosis and embolism: Secondary | ICD-10-CM | POA: Diagnosis not present

## 2014-03-23 DIAGNOSIS — M810 Age-related osteoporosis without current pathological fracture: Secondary | ICD-10-CM | POA: Diagnosis not present

## 2014-03-23 DIAGNOSIS — I10 Essential (primary) hypertension: Secondary | ICD-10-CM | POA: Diagnosis not present

## 2014-03-23 DIAGNOSIS — J449 Chronic obstructive pulmonary disease, unspecified: Secondary | ICD-10-CM | POA: Diagnosis not present

## 2014-03-23 DIAGNOSIS — M069 Rheumatoid arthritis, unspecified: Secondary | ICD-10-CM | POA: Diagnosis not present

## 2014-03-23 DIAGNOSIS — I4891 Unspecified atrial fibrillation: Secondary | ICD-10-CM | POA: Diagnosis not present

## 2014-03-23 DIAGNOSIS — Z48812 Encounter for surgical aftercare following surgery on the circulatory system: Secondary | ICD-10-CM | POA: Diagnosis not present

## 2014-03-24 DIAGNOSIS — I10 Essential (primary) hypertension: Secondary | ICD-10-CM | POA: Diagnosis not present

## 2014-03-24 DIAGNOSIS — M810 Age-related osteoporosis without current pathological fracture: Secondary | ICD-10-CM | POA: Diagnosis not present

## 2014-03-24 DIAGNOSIS — Z48812 Encounter for surgical aftercare following surgery on the circulatory system: Secondary | ICD-10-CM | POA: Diagnosis not present

## 2014-03-24 DIAGNOSIS — J449 Chronic obstructive pulmonary disease, unspecified: Secondary | ICD-10-CM | POA: Diagnosis not present

## 2014-03-24 DIAGNOSIS — I4891 Unspecified atrial fibrillation: Secondary | ICD-10-CM | POA: Diagnosis not present

## 2014-03-24 DIAGNOSIS — M069 Rheumatoid arthritis, unspecified: Secondary | ICD-10-CM | POA: Diagnosis not present

## 2014-03-28 DIAGNOSIS — M810 Age-related osteoporosis without current pathological fracture: Secondary | ICD-10-CM | POA: Diagnosis not present

## 2014-03-28 DIAGNOSIS — M069 Rheumatoid arthritis, unspecified: Secondary | ICD-10-CM | POA: Diagnosis not present

## 2014-03-28 DIAGNOSIS — I10 Essential (primary) hypertension: Secondary | ICD-10-CM | POA: Diagnosis not present

## 2014-03-28 DIAGNOSIS — J449 Chronic obstructive pulmonary disease, unspecified: Secondary | ICD-10-CM | POA: Diagnosis not present

## 2014-03-28 DIAGNOSIS — Z48812 Encounter for surgical aftercare following surgery on the circulatory system: Secondary | ICD-10-CM | POA: Diagnosis not present

## 2014-03-28 DIAGNOSIS — I4891 Unspecified atrial fibrillation: Secondary | ICD-10-CM | POA: Diagnosis not present

## 2014-04-11 DIAGNOSIS — J189 Pneumonia, unspecified organism: Secondary | ICD-10-CM | POA: Diagnosis not present

## 2014-04-11 DIAGNOSIS — R0602 Shortness of breath: Secondary | ICD-10-CM | POA: Diagnosis not present

## 2014-04-11 DIAGNOSIS — R0789 Other chest pain: Secondary | ICD-10-CM | POA: Diagnosis not present

## 2014-04-11 DIAGNOSIS — I10 Essential (primary) hypertension: Secondary | ICD-10-CM | POA: Diagnosis not present

## 2014-04-11 DIAGNOSIS — S2249XA Multiple fractures of ribs, unspecified side, initial encounter for closed fracture: Secondary | ICD-10-CM | POA: Diagnosis not present

## 2014-04-11 DIAGNOSIS — M542 Cervicalgia: Secondary | ICD-10-CM | POA: Diagnosis not present

## 2014-04-11 DIAGNOSIS — S2231XA Fracture of one rib, right side, initial encounter for closed fracture: Secondary | ICD-10-CM | POA: Diagnosis not present

## 2014-04-11 DIAGNOSIS — Z4682 Encounter for fitting and adjustment of non-vascular catheter: Secondary | ICD-10-CM | POA: Diagnosis not present

## 2014-04-11 DIAGNOSIS — W19XXXA Unspecified fall, initial encounter: Secondary | ICD-10-CM | POA: Diagnosis not present

## 2014-04-11 DIAGNOSIS — Z87891 Personal history of nicotine dependence: Secondary | ICD-10-CM | POA: Diagnosis not present

## 2014-04-11 LAB — CBC
HCT: 35.4 % — ABNORMAL LOW (ref 40.0–52.0)
HGB: 11.5 g/dL — AB (ref 13.0–18.0)
MCH: 30.3 pg (ref 26.0–34.0)
MCHC: 32.4 g/dL (ref 32.0–36.0)
MCV: 94 fL (ref 80–100)
PLATELETS: 316 10*3/uL (ref 150–440)
RBC: 3.78 10*6/uL — AB (ref 4.40–5.90)
RDW: 17.8 % — AB (ref 11.5–14.5)
WBC: 17.2 10*3/uL — ABNORMAL HIGH (ref 3.8–10.6)

## 2014-04-11 LAB — COMPREHENSIVE METABOLIC PANEL
ANION GAP: 7 (ref 7–16)
AST: 24 U/L (ref 15–37)
Albumin: 3 g/dL — ABNORMAL LOW (ref 3.4–5.0)
Alkaline Phosphatase: 80 U/L
BILIRUBIN TOTAL: 0.7 mg/dL (ref 0.2–1.0)
BUN: 20 mg/dL — AB (ref 7–18)
CALCIUM: 8.3 mg/dL — AB (ref 8.5–10.1)
CO2: 28 mmol/L (ref 21–32)
CREATININE: 0.95 mg/dL (ref 0.60–1.30)
Chloride: 102 mmol/L (ref 98–107)
EGFR (Non-African Amer.): >60
GLUCOSE: 220 mg/dL — AB (ref 65–99)
Osmolality: 283 (ref 275–301)
Potassium: 4.3 mmol/L (ref 3.5–5.1)
SGPT (ALT): 19 U/L
Sodium: 137 mmol/L (ref 136–145)
TOTAL PROTEIN: 8 g/dL (ref 6.4–8.2)

## 2014-04-11 LAB — TROPONIN I: Troponin-I: <0.02 ng/mL

## 2014-04-11 LAB — PRO B NATRIURETIC PEPTIDE: B-TYPE NATIURETIC PEPTID: 836 pg/mL — AB (ref 0–125)

## 2014-04-12 ENCOUNTER — Inpatient Hospital Stay: Payer: Self-pay | Admitting: Surgery

## 2014-04-12 DIAGNOSIS — R001 Bradycardia, unspecified: Secondary | ICD-10-CM | POA: Diagnosis not present

## 2014-04-12 DIAGNOSIS — J9383 Other pneumothorax: Secondary | ICD-10-CM | POA: Diagnosis not present

## 2014-04-12 DIAGNOSIS — M810 Age-related osteoporosis without current pathological fracture: Secondary | ICD-10-CM | POA: Diagnosis present

## 2014-04-12 DIAGNOSIS — S2249XA Multiple fractures of ribs, unspecified side, initial encounter for closed fracture: Secondary | ICD-10-CM | POA: Diagnosis not present

## 2014-04-12 DIAGNOSIS — I252 Old myocardial infarction: Secondary | ICD-10-CM | POA: Diagnosis not present

## 2014-04-12 DIAGNOSIS — J939 Pneumothorax, unspecified: Secondary | ICD-10-CM | POA: Diagnosis not present

## 2014-04-12 DIAGNOSIS — C61 Malignant neoplasm of prostate: Secondary | ICD-10-CM | POA: Diagnosis not present

## 2014-04-12 DIAGNOSIS — Z8546 Personal history of malignant neoplasm of prostate: Secondary | ICD-10-CM | POA: Diagnosis not present

## 2014-04-12 DIAGNOSIS — Z87891 Personal history of nicotine dependence: Secondary | ICD-10-CM | POA: Diagnosis not present

## 2014-04-12 DIAGNOSIS — M069 Rheumatoid arthritis, unspecified: Secondary | ICD-10-CM | POA: Diagnosis present

## 2014-04-12 DIAGNOSIS — R0789 Other chest pain: Secondary | ICD-10-CM | POA: Diagnosis not present

## 2014-04-12 DIAGNOSIS — J9811 Atelectasis: Secondary | ICD-10-CM | POA: Diagnosis not present

## 2014-04-12 DIAGNOSIS — I481 Persistent atrial fibrillation: Secondary | ICD-10-CM | POA: Diagnosis not present

## 2014-04-12 DIAGNOSIS — Z681 Body mass index (BMI) 19 or less, adult: Secondary | ICD-10-CM | POA: Diagnosis not present

## 2014-04-12 DIAGNOSIS — Z4682 Encounter for fitting and adjustment of non-vascular catheter: Secondary | ICD-10-CM | POA: Diagnosis not present

## 2014-04-12 DIAGNOSIS — E780 Pure hypercholesterolemia: Secondary | ICD-10-CM | POA: Diagnosis present

## 2014-04-12 DIAGNOSIS — T447X5A Adverse effect of beta-adrenoreceptor antagonists, initial encounter: Secondary | ICD-10-CM | POA: Diagnosis present

## 2014-04-12 DIAGNOSIS — K219 Gastro-esophageal reflux disease without esophagitis: Secondary | ICD-10-CM | POA: Diagnosis present

## 2014-04-12 DIAGNOSIS — J90 Pleural effusion, not elsewhere classified: Secondary | ICD-10-CM | POA: Diagnosis not present

## 2014-04-12 DIAGNOSIS — S42009A Fracture of unspecified part of unspecified clavicle, initial encounter for closed fracture: Secondary | ICD-10-CM | POA: Diagnosis not present

## 2014-04-12 DIAGNOSIS — Z7982 Long term (current) use of aspirin: Secondary | ICD-10-CM | POA: Diagnosis not present

## 2014-04-12 DIAGNOSIS — D649 Anemia, unspecified: Secondary | ICD-10-CM | POA: Diagnosis not present

## 2014-04-12 DIAGNOSIS — R0602 Shortness of breath: Secondary | ICD-10-CM | POA: Diagnosis not present

## 2014-04-12 DIAGNOSIS — J189 Pneumonia, unspecified organism: Secondary | ICD-10-CM | POA: Diagnosis not present

## 2014-04-12 DIAGNOSIS — I480 Paroxysmal atrial fibrillation: Secondary | ICD-10-CM | POA: Diagnosis not present

## 2014-04-12 DIAGNOSIS — Z86718 Personal history of other venous thrombosis and embolism: Secondary | ICD-10-CM | POA: Diagnosis not present

## 2014-04-12 DIAGNOSIS — S270XXA Traumatic pneumothorax, initial encounter: Secondary | ICD-10-CM | POA: Diagnosis not present

## 2014-04-12 DIAGNOSIS — S2231XA Fracture of one rib, right side, initial encounter for closed fracture: Secondary | ICD-10-CM | POA: Diagnosis present

## 2014-04-12 DIAGNOSIS — R64 Cachexia: Secondary | ICD-10-CM | POA: Diagnosis not present

## 2014-04-12 DIAGNOSIS — I10 Essential (primary) hypertension: Secondary | ICD-10-CM | POA: Diagnosis not present

## 2014-04-12 DIAGNOSIS — J449 Chronic obstructive pulmonary disease, unspecified: Secondary | ICD-10-CM | POA: Diagnosis not present

## 2014-04-12 DIAGNOSIS — R918 Other nonspecific abnormal finding of lung field: Secondary | ICD-10-CM | POA: Diagnosis not present

## 2014-04-12 LAB — POTASSIUM: Potassium: 4.4 mmol/L (ref 3.5–5.1)

## 2014-04-13 LAB — HEMOGLOBIN A1C: Hemoglobin A1C: 4.8 % (ref 4.2–6.3)

## 2014-04-13 LAB — CREATININE, SERUM
CREATININE: 0.89 mg/dL (ref 0.60–1.30)
EGFR (African American): >60

## 2014-04-14 LAB — CBC WITH DIFFERENTIAL/PLATELET
BASOS PCT: 0.4 %
BASOS PCT: 0.4 %
Basophil #: 0 10*3/uL (ref 0.0–0.1)
Basophil #: 0 10*3/uL (ref 0.0–0.1)
EOS PCT: 1.5 %
Eosinophil #: 0.1 10*3/uL (ref 0.0–0.7)
Eosinophil #: 0.1 10*3/uL (ref 0.0–0.7)
Eosinophil %: 1.7 %
HCT: 25.8 % — ABNORMAL LOW (ref 40.0–52.0)
HCT: 25.9 % — ABNORMAL LOW (ref 40.0–52.0)
HGB: 8.3 g/dL — AB (ref 13.0–18.0)
HGB: 8.5 g/dL — ABNORMAL LOW (ref 13.0–18.0)
LYMPHS ABS: 0.6 10*3/uL — AB (ref 1.0–3.6)
LYMPHS PCT: 11.7 %
Lymphocyte #: 0.7 10*3/uL — ABNORMAL LOW (ref 1.0–3.6)
Lymphocyte %: 11.7 %
MCH: 30.3 pg (ref 26.0–34.0)
MCH: 30.7 pg (ref 26.0–34.0)
MCHC: 32.2 g/dL (ref 32.0–36.0)
MCHC: 32.8 g/dL (ref 32.0–36.0)
MCV: 94 fL (ref 80–100)
MCV: 94 fL (ref 80–100)
Monocyte #: 0.6 x10 3/mm (ref 0.2–1.0)
Monocyte #: 0.7 x10 3/mm (ref 0.2–1.0)
Monocyte %: 11 %
Monocyte %: 11 %
NEUTROS ABS: 4.1 10*3/uL (ref 1.4–6.5)
NEUTROS PCT: 75.4 %
Neutrophil #: 4.7 10*3/uL (ref 1.4–6.5)
Neutrophil %: 75.2 %
PLATELETS: 204 10*3/uL (ref 150–440)
Platelet: 190 10*3/uL (ref 150–440)
RBC: 2.74 10*6/uL — ABNORMAL LOW (ref 4.40–5.90)
RBC: 2.77 10*6/uL — AB (ref 4.40–5.90)
RDW: 17 % — AB (ref 11.5–14.5)
RDW: 16.8 % — ABNORMAL HIGH (ref 11.5–14.5)
WBC: 5.4 10*3/uL (ref 3.8–10.6)
WBC: 6.2 10*3/uL (ref 3.8–10.6)

## 2014-04-21 ENCOUNTER — Ambulatory Visit: Payer: Self-pay | Admitting: Family Medicine

## 2014-04-21 DIAGNOSIS — J90 Pleural effusion, not elsewhere classified: Secondary | ICD-10-CM | POA: Diagnosis not present

## 2014-04-21 DIAGNOSIS — S42022A Displaced fracture of shaft of left clavicle, initial encounter for closed fracture: Secondary | ICD-10-CM | POA: Diagnosis not present

## 2014-04-21 DIAGNOSIS — J449 Chronic obstructive pulmonary disease, unspecified: Secondary | ICD-10-CM | POA: Diagnosis not present

## 2014-04-21 DIAGNOSIS — J939 Pneumothorax, unspecified: Secondary | ICD-10-CM | POA: Diagnosis not present

## 2014-04-24 DIAGNOSIS — I4891 Unspecified atrial fibrillation: Secondary | ICD-10-CM | POA: Diagnosis not present

## 2014-04-24 DIAGNOSIS — I714 Abdominal aortic aneurysm, without rupture: Secondary | ICD-10-CM | POA: Diagnosis not present

## 2014-04-24 DIAGNOSIS — Z7901 Long term (current) use of anticoagulants: Secondary | ICD-10-CM | POA: Diagnosis not present

## 2014-04-24 DIAGNOSIS — Z885 Allergy status to narcotic agent status: Secondary | ICD-10-CM | POA: Diagnosis not present

## 2014-04-24 DIAGNOSIS — M069 Rheumatoid arthritis, unspecified: Secondary | ICD-10-CM | POA: Diagnosis not present

## 2014-04-24 DIAGNOSIS — J939 Pneumothorax, unspecified: Secondary | ICD-10-CM | POA: Diagnosis not present

## 2014-04-24 DIAGNOSIS — S2249XA Multiple fractures of ribs, unspecified side, initial encounter for closed fracture: Secondary | ICD-10-CM | POA: Diagnosis not present

## 2014-04-24 DIAGNOSIS — M810 Age-related osteoporosis without current pathological fracture: Secondary | ICD-10-CM | POA: Diagnosis not present

## 2014-04-24 DIAGNOSIS — I10 Essential (primary) hypertension: Secondary | ICD-10-CM | POA: Diagnosis not present

## 2014-04-24 DIAGNOSIS — J449 Chronic obstructive pulmonary disease, unspecified: Secondary | ICD-10-CM | POA: Diagnosis not present

## 2014-04-24 DIAGNOSIS — R05 Cough: Secondary | ICD-10-CM | POA: Diagnosis not present

## 2014-05-04 DIAGNOSIS — Z09 Encounter for follow-up examination after completed treatment for conditions other than malignant neoplasm: Secondary | ICD-10-CM | POA: Diagnosis not present

## 2014-05-04 DIAGNOSIS — R634 Abnormal weight loss: Secondary | ICD-10-CM | POA: Diagnosis not present

## 2014-05-04 DIAGNOSIS — Z7982 Long term (current) use of aspirin: Secondary | ICD-10-CM | POA: Diagnosis not present

## 2014-05-04 DIAGNOSIS — I716 Thoracoabdominal aortic aneurysm, without rupture: Secondary | ICD-10-CM | POA: Diagnosis not present

## 2014-05-04 DIAGNOSIS — I4891 Unspecified atrial fibrillation: Secondary | ICD-10-CM | POA: Diagnosis not present

## 2014-05-04 DIAGNOSIS — Z885 Allergy status to narcotic agent status: Secondary | ICD-10-CM | POA: Diagnosis not present

## 2014-05-04 DIAGNOSIS — M810 Age-related osteoporosis without current pathological fracture: Secondary | ICD-10-CM | POA: Diagnosis not present

## 2014-05-04 DIAGNOSIS — M069 Rheumatoid arthritis, unspecified: Secondary | ICD-10-CM | POA: Diagnosis not present

## 2014-05-04 DIAGNOSIS — J449 Chronic obstructive pulmonary disease, unspecified: Secondary | ICD-10-CM | POA: Diagnosis not present

## 2014-05-04 DIAGNOSIS — M549 Dorsalgia, unspecified: Secondary | ICD-10-CM | POA: Diagnosis not present

## 2014-05-04 DIAGNOSIS — I10 Essential (primary) hypertension: Secondary | ICD-10-CM | POA: Diagnosis not present

## 2014-05-04 DIAGNOSIS — Z7901 Long term (current) use of anticoagulants: Secondary | ICD-10-CM | POA: Diagnosis not present

## 2014-05-15 ENCOUNTER — Ambulatory Visit: Payer: Self-pay | Admitting: Family Medicine

## 2014-05-15 DIAGNOSIS — J939 Pneumothorax, unspecified: Secondary | ICD-10-CM | POA: Diagnosis not present

## 2014-05-15 DIAGNOSIS — I700 Atherosclerosis of aorta: Secondary | ICD-10-CM | POA: Diagnosis not present

## 2014-05-15 DIAGNOSIS — J90 Pleural effusion, not elsewhere classified: Secondary | ICD-10-CM | POA: Diagnosis not present

## 2014-05-15 DIAGNOSIS — J449 Chronic obstructive pulmonary disease, unspecified: Secondary | ICD-10-CM | POA: Diagnosis not present

## 2014-05-15 DIAGNOSIS — S299XXA Unspecified injury of thorax, initial encounter: Secondary | ICD-10-CM | POA: Diagnosis not present

## 2014-05-16 DIAGNOSIS — Z79899 Other long term (current) drug therapy: Secondary | ICD-10-CM | POA: Diagnosis not present

## 2014-05-16 DIAGNOSIS — M0589 Other rheumatoid arthritis with rheumatoid factor of multiple sites: Secondary | ICD-10-CM | POA: Diagnosis not present

## 2014-06-16 HISTORY — PX: AORTA SURGERY: SHX548

## 2014-07-14 ENCOUNTER — Ambulatory Visit: Payer: Self-pay | Admitting: Family Medicine

## 2014-07-14 DIAGNOSIS — Z23 Encounter for immunization: Secondary | ICD-10-CM | POA: Diagnosis not present

## 2014-07-14 DIAGNOSIS — J90 Pleural effusion, not elsewhere classified: Secondary | ICD-10-CM | POA: Diagnosis not present

## 2014-07-14 DIAGNOSIS — R05 Cough: Secondary | ICD-10-CM | POA: Diagnosis not present

## 2014-07-14 DIAGNOSIS — J449 Chronic obstructive pulmonary disease, unspecified: Secondary | ICD-10-CM | POA: Diagnosis not present

## 2014-09-27 DIAGNOSIS — Z09 Encounter for follow-up examination after completed treatment for conditions other than malignant neoplasm: Secondary | ICD-10-CM | POA: Diagnosis not present

## 2014-09-27 DIAGNOSIS — I723 Aneurysm of iliac artery: Secondary | ICD-10-CM | POA: Diagnosis not present

## 2014-09-27 DIAGNOSIS — J90 Pleural effusion, not elsewhere classified: Secondary | ICD-10-CM | POA: Diagnosis not present

## 2014-09-27 DIAGNOSIS — I716 Thoracoabdominal aortic aneurysm, without rupture: Secondary | ICD-10-CM | POA: Diagnosis not present

## 2014-09-27 DIAGNOSIS — I714 Abdominal aortic aneurysm, without rupture: Secondary | ICD-10-CM | POA: Diagnosis not present

## 2014-09-27 DIAGNOSIS — E041 Nontoxic single thyroid nodule: Secondary | ICD-10-CM | POA: Diagnosis not present

## 2014-09-27 DIAGNOSIS — Z96641 Presence of right artificial hip joint: Secondary | ICD-10-CM | POA: Diagnosis not present

## 2014-10-06 NOTE — Consult Note (Signed)
Reason for Visit: This 69 year old Male patient presents to the clinic for initial evaluation of  prostate cancer .   Referred by Dr. [name].  Diagnosis:  Chief Complaint/Diagnosis   69 year old male with clinical stage II 8 (T1 C. N0 M0) adenocarcinoma prostate presenting with a PSA of 5.7 and a Gleason score of 7 (3+4).  Pathology Report pathology report reviewed   Referral Report clinical notes reviewed   Planned Treatment Regimen image guided IMRT radiation therapy   HPI   patient is a 69 year old male presented to his PMD Dr. [name] with a rising PSA in the 5.7 range. He was seen by urology and underwent ultrasound-guided biopsy on May 19, 2012 found to have adenocarcinoma of the prostatebi lobar disease with highest Gleason score of 7 (3+4). His clinical prostate volume was in the 28 ml range. He's had very little GI GU symptoms although does tend towards slight constipation. He also is being evaluated by abdominal aortic aneurysm. He is on Coumadin for that plus a DVT and history COPD. He is also status post right hip replacement. He has been seen by radiation oncology in [place] and recommended IMRT treatment and is seen today closer to home for consideration of treatment.  Past Hx:    Prostate Cancer:    Rheumatoid Arthritis:    COPD:    Hypertension:    Hypercholesterolemia:    Osteoporosis:    Aortic disection:    GERD - Esophageal Reflux:    Blood Clots:    MI - Myocardial Infarct:    Hip Surgery - Right:    Hip Replacement - Right:   Past, Family and Social History:  Past Medical History positive   Cardiovascular hyperlipidemia; hypertension; myocardial infarction   Respiratory COPD; pulmonary embolism   Gastrointestinal GERD   Past Surgical History right hip replacement   Past Medical History Comments rheumatoid arthritis, history of blood clots,osteoporosis, abdominal aortic aneurysm   Family History positive   Family  History Comments mother with COPD and non-Hodgkin's lymphoma   Social History positive   Social History Comments patient is a 50-pack-year smoking history quit smoking 9 years prior. No EtOH abuse history   Additional Past Medical and Surgical History accompanied by his daughter today   Allergies:   No Known Allergies:   Home Meds:  Home Medications: Medication Instructions Status  aspirin 325 mg oral tablet 1  orally once a day  Active  enalapril 5 mg oral tablet 1  orally 2 times a day  Active  folic acid 1 mg oral tablet 1  orally once a day  Active  simvastatin 20 mg oral tablet 1  orally once a day  Active  Nexium 40 mg oral delayed release capsule 1  orally 2 times a day  Active  alendronate 70 mg oral tablet 1  orally once a week  Active  Nascobal 500 mcg/0.1 mL nasal gel 1 spray(s) nasal once a week  Active  Spiriva 18 mcg inhalation capsule 1  inhaled once a day  Active  Iron 65 mg. 1 tab(s) orally once a day Active  calcium carbonate 1200 mg oral capsule 1   2 times a day Active  methotrexate 2.5 mg oral tablet 9  orally once a week.  Active  metoprolol succinate 50 mg oral tablet, extended release 0.5  orally once a day Active  warfarin 5 mg oral tablet One whole tablet on Monday and Friday. One-half tablet Tuesday, Wednesday, Thursday, Saturday, and  Sunday.  Active  Nasonex 50 mcg/inh nasal spray 2  nasal sprays once a day in each nostril. Active   Review of Systems:  General negative   Performance Status (ECOG) 0   Skin negative   Breast negative   Ophthalmologic negative   ENMT negative   Respiratory and Thorax see HPI   Cardiovascular see HPI   Gastrointestinal negative   Genitourinary see HPI   Musculoskeletal negative   Neurological negative   Psychiatric negative   Hematology/Lymphatics see HPI   Endocrine negative   Allergic/Immunologic negative   Review of Systems   according to the nurse's notesPatient denies any weight loss,  fatigue, weakness, fever, chills or night sweats. Patient denies any loss of vision, blurred vision. Patient denies any ringing  of the ears or hearing loss. No irregular heartbeat. Patient denies heart murmur or history of fainting. Patient denies any chest pain or pain radiating to her upper extremities. Patient denies any shortness of breath, difficulty breathing at night, cough or hemoptysis. Patient denies any swelling in the lower legs. Patient denies any nausea vomiting, vomiting of blood, or coffee ground material in the vomitus. Patient denies any stomach pain. Patient states has had normal bowel movements no significant constipation or diarrhea. Patient denies any dysuria, hematuria or significant nocturia. Patient denies any problems walking, swelling in the joints or loss of balance. Patient denies any skin changes, loss of hair or loss of weight. Patient denies any excessive worrying or anxiety or significant depression. Patient denies any problems with insomnia. Patient denies excessive thirst, polyuria, polydipsia. Patient denies any swollen glands, patient denies easy bruising or easy bleeding. Patient denies any recent infections, allergies or URI. Patient "s visual fields have not changed significantly in recent time.  Nursing Notes:  Nursing Vital Signs and Chemo Nursing Nursing Notes: *CC Vital Signs Flowsheet:   05-May-14 08:53  Temp Temperature 97.6  Pulse Pulse 68  Respirations Respirations 20  SBP SBP 120  DBP DBP 75  Pain Scale (0-10)  0  Current Weight (kg) (kg) 68.9  Height (cm) centimeters 169.3  BSA (m2) 1.7   Physical Exam:  General/Skin/HEENT:  General normal   Skin normal   Eyes normal   ENMT normal   Head and Neck normal   Additional PE a well-developed well-nourished frail appearing male in NAD. Lungs are clear to A&P cardiac examination shows regular rate and rhythm. Abdomen is benign with no organomegaly or masses noted. On rectal exam rectal sphincter  tone is good. Prostate is smooth without evidence of nodularity or mass. Measures about 30 ml in overall volume. Sulcus is preserved bilaterally. No other rectal abnormalities identified.   Breasts/Resp/CV/GI/GU:  Respiratory and Thorax normal   Cardiovascular normal   Gastrointestinal normal   Genitourinary normal   MS/Neuro/Psych/Lymph:  Musculoskeletal normal   Neurological normal   Lymphatics normal   Assessment and Plan: Impression:   clinical stage IIa adenocarcinoma the prostate in 69 year old male with multiple medical comorbidities. Plan:   at this time patient has decided on external beam IMRT treatment for his prostate. I would plan on delivering 8000 cGy over 8 weeks using image guided treatment. I've asked Dr. Tresa Moore to place gold fiducial markers for daily image guided treatment. Risks and benefits of treatment including exacerbation of diarrhea, exacerbation of urinary frequency urgency and nocturia, fatigue, alteration of blood counts, were all explained in detail to the patient. He or he does have a history of erectile dysfunction and I explained that even  worsened over the course of treatment. Patient has agreed to treatment and will have his gold markers placed on may 20th 2014. I'll see the patient Juan Fernandez thereafter to begin CT simulation and treatment planning.  I would like to take this opportunity to thank you for allowing me to continue to participate in this patient's care.  CC Referral:  cc: Dr. [name],, Dr. Tresa Moore, Dr. [name], Dr. Madelin Headings   Electronic Signatures: Baruch Gouty, Roda Shutters (MD)  (Signed [phone] 12:37)  Authored: HPI, Diagnosis, Past Hx, PFSH, Allergies, Home Meds, ROS, Nursing Notes, Physical Exam, Encounter Assessment and Plan, CC Referring Physician   Last Updated: 05-May-14 12:37 by Armstead Peaks (MD)

## 2014-10-07 NOTE — Op Note (Signed)
PATIENT NAME:  Juan Fernandez, Juan Fernandez MR#:  [id number] DATE OF BIRTH:  29-Mar-1946  DATE OF PROCEDURE:  04/12/2014   PREOPERATIVE DIAGNOSIS: Right pneumothorax.   POSTOPERATIVE DIAGNOSIS:   Right pneumothorax.   PROCEDURE:  Right chest tube placement.  ANESTHESIA: Local.  SURGEON: Chibuikem Thang E. Burt Knack, MD   INDICATIONS: This is a patient who presents after a fall with right chest pain and shortness of breath. Chest x-ray and physical exam are consistent with a right pneumothorax.   FINDINGS: Right pneumothorax entered without difficulty. Post procedure chest film is currently pending. Consent was obtained by nursing, but I discussed personally with the patient and with the family the rationale for placing the chest tube, the risks of bleeding, especially with his anticoagulation, the risk of a thoracotomy or recurrent pneumothorax, or intrathoracic hemorrhage. He understood and agreed to proceed.   DESCRIPTION OF PROCEDURE: The patient was identified, placed in the left lateral position, prepped and draped in a sterile fashion. Local anesthetic was infiltrated in skin and subcutaneous tissues at approximately right 5th rib, anterior axillary line. The 22-gauge needle was utilized to enter the chest cavity demonstrating bubbles of air confirming the presence of a pneumothorax.   An incision was made. Dissection down to the rib was performed. Over the top of the rib was placed a 20 French chest tube that was included in the provided kit.  This was tied in with of the provided silk suture, and sterile dressing was placed. The chest tube was placed to Pleur-Evac suction with an obvious air leak. A postoperative chest film was ordered. The patient was left in stable condition in the ER to be admitted.  See admission note.    ____________________________ [name]. Burt Knack, MD rec:jp D: 04/12/2014 01:13:57 ET T: 04/12/2014 07:39:52 ET JOB#: [id number]  cc: [name]. Burt Knack, MD, <Dictator> Florene Glen  MD ELECTRONICALLY SIGNED 04/12/2014 20:19

## 2014-10-07 NOTE — Consult Note (Signed)
PATIENT NAME:  Juan Fernandez, Juan Fernandez MR#:  [id number] DATE OF BIRTH:  27-Apr-1946  DATE OF CONSULTATION:  04/12/2014  CONSULTING PHYSICIAN:  Norva Riffle. Marcille Blanco, MD  REASON FOR CONSULT:  Medical management.   REFERRING PHYSICIAN:  Dr. Burt Knack.   PRIMARY CARE PHYSICIAN:  Dr. Alm Bustard.   HISTORY OF PRESENT ILLNESS: This is a 69 year old Caucasian male who presents to the Emergency Department via EMS for shortness of breath. The patient states that it began after falling down 2 steps, and hitting his chest. The patient has a history of recurrent falls and just completed a number of weeks of physical therapy and occupational therapy due to overall debilitation. In the Emergency Department he was placed on BiPAP but then found to have a pneumothorax when his respiratory rate and effort increased. A chest tube was placed and the patient improved dramatically. He has been admitted to the surgery service, who consulted for medical management.   REVIEW OF SYSTEMS:   CONSTITUTIONAL: The patient denies fever, but admits to weakness.  EYES: Denies blurred vision or inflammation.  EARS, NOSE AND THROAT: Denies tinnitus or difficulty swallowing.  RESPIRATORY: Denies cough, but admits to dyspnea and chronic obstructive pulmonary disease. CARDIOVASCULAR: Denies chest pain, palpitations or orthopnea.  GASTROINTESTINAL: Denies nausea, vomiting, diarrhea or abdominal pain.  GENITOURINARY: Denies dysuria, increased frequency or hesitancy.  ENDOCRINE: Denies polyuria or nocturia.  HEMATOLOGIC AND LYMPHATIC: Admits to easy bruising and bleeding.  INTEGUMENTARY: Denies rashes or lesions.  MUSCULOSKELETAL: Denies myalgias, but admits to arthralgias, which is a chronic complaint.  NEUROLOGIC: Denies numbness in his extremities or dysarthria.  PSYCHIATRIC: Denies depression, anxiety, or suicidal ideation.  PAST MEDICAL HISTORY: Prostate cancer status post brachytherapy, now in remission, COPD, recurrent DVT and pulmonary  embolism, recurrent falls resulting in concussions and some memory loss, rheumatoid arthritis, osteoporosis, and a lung nodule.   PAST SURGICAL HISTORY: Right hip replacement, thoracic ascending aneurysm repair as well as femoral and/or iliac stent placement.   SOCIAL HISTORY: The patient quit smoking 10 years ago. He denies alcohol or drug use. He lives alone but his daughter is his active caretaker.   FAMILY HISTORY: His mother is deceased of lymphoma. Hypertension also runs throughout the family.   HOME MEDICATIONS:  1.  Alendronate 70 mg 1 tab p.o. weekly.  2.  Aspirin 81 mg 1 tab p.o. daily.  3.  Calcium carbonate 1200 mg 1 capsule p.o. b.i.d.  4.  Cholecalciferol 1000 International Units 1 tablet p.o. daily.  5.  Docusate sodium 100 mg 1 capsule p.o. b.i.d.  6.  Dofetilide 500 mcg 1 capsule p.o. b.i.d.  7.  Eliquis 5 mg 1 tablet p.o. b.i.d.  8.  Enalapril 5 mg 1/2 tablet p.o. b.i.d.  9.  Ferrous sulfate 325 mg delayed release 1 tablet p.o. daily.  10. Finasteride 5 mg 1 tab p.o. daily.  11. Flunisolide nasal spray 25 mcg/inhalation 2 sprays b.i.d.  12. Folic acid 1 mg 1 tablet p.o. daily.  13. Magnesium oxide 400 mg 1 tablet p.o. daily.  14. Methotrexate 2.5 mg 1 tablet p.o. once weekly.  15. Metoprolol succinate 50 mg extended release tablet 1/2 tablet p.o. b.i.d.  16. Nascobal 500 mcg/0.1 mL nasal gel spray 1 spray nasally weekly.  17. Nexium 40 mg 1 capsule p.o. daily.  18. Oxycodone 5 mg 1-2 capsules p.o. every 4 hours as needed.  19. Simvastatin 20 mg 1 tablet p.o. daily.  20. Spiriva 18 mcg 1 capsule inhaled daily.  21. Vitamin B12 spray,  1 spray nasally monthly.  22. Zolpidem 10 mg 1 tablet p.o. at bedtime.   ALLERGIES: MORPHINE SULFATE.   PERTINENT LABORATORY RESULTS AND RADIOGRAPHIC FINDINGS: Serum glucose 220, BUN 20, creatinine 0.95, sodium 137, potassium 4.3, chloride 102, bicarbonate 28, calcium is 8.3. Serum albumin is 3, total bilirubin 0.7, alkaline phosphatase  80, AST 24, ALT 19, troponin less than 0.02. White blood cell count 17.2, hemoglobin 11.5, hematocrit 35.4, BNP 836.  Chest x-ray following chest tube placement shows interval placement and re-expansion of the right lung with a tiny amount of residual pleural air. There are posterior right 8th and 9th rib fractures. There is a trace right pleural effusion and small to moderate left pleural effusion. There are some associated air space opacities that favor atelectasis. There is also a fractured right proximal clavicle.   PHYSICAL EXAMINATION:  VITAL SIGNS: Temperature is 96.7, pulse 53, respirations 18, blood pressure 109/55, pulse oximetry 100% on nasal cannula.  GENERAL: The patient is alert and oriented x 3 in no apparent distress.  HEENT: Normocephalic, atraumatic. Pupils equal, round, and reactive to light and accommodation. Extraocular movements are intact. Mucous membranes are moist.  NECK: Trachea is midline. No adenopathy. Mildly asymmetric due to a hematoma over the right proximal clavicle. There is some deformity when the patient adducts his arms.  CARDIOVASCULAR: Bradycardic rhythm with normal S1, S2. No rubs, clicks, or murmurs appreciated.  CHEST:  Chest tube in place and sealed, right lower hemithorax.  LUNGS: Clear to auscultation bilaterally with air movement and right lower lung field much improved status post chest tube. Normal effort and excursion.  ABDOMEN: Positive bowel sounds. Soft, nontender, nondistended.  GENITOURINARY: Deferred.  MUSCULOSKELETAL: The patient moves all 4 extremities equally. There is 5 out of 5 strength in the upper and lower extremities bilaterally.  SKIN: There are multiple areas of ecchymoses on the upper and lower extremities. No rashes or lesions.  EXTREMITIES: No clubbing, cyanosis, or edema.  NEUROLOGIC: Cranial nerves II-XII are grossly intact. The patient's long-term memory is mildly impaired.  PSYCHIATRIC: Mood is normal. Affect is congruent.    ASSESSMENT AND PLAN: This is a 69 year old male with a right pneumothorax, chronic obstructive pulmonary disease, intermittent bradycardia, recurrent deep vein thrombosis and pulmonary embolism with multiple rib fractures and a proximal clavicle fracture.  1.  Pneumothorax. Currently no evidence of tension pneumothorax. We will discontinue BiPAP as we do not want to deliver positive airway pressure to the patient with an air leak.  2.  Chronic obstructive pulmonary disease.  Will add Advair the patient's regimen. Continue Spiriva and we will add albuterol as needed for shortness of breath or cough.  3.  Bradycardia. EKG shows sinus rhythm without heart block. The patient's blood pressure is  stable at this time, albeit at the low end of normal. Differential diagnosis for bradycardia includes methotrexate and sick sinus syndrome in this case. Also the patient is on a beta blocker as well as dofetilide, which could contribute to his bradycardia, although presumably both these medicines are for controlling atrial fibrillation. We will continue to monitor the patient as he is asymptomatic at this time, but I plan to decrease his metoprolol dosing. We will consult cardiology for management of dofetilide.  4.  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 I will hold the patient's Eliquis at this time. When deemed safe by surgery I recommend switching the patient's anticoagulation to heparin while in the hospital. This is wise because heparin can be reversed in the  event of urgent surgical need. He has a chest tube in place and we will monitor that for any output of blood that would indicate urgent need for reversal of anticoagulation, but at this time with the patient's recurrent history of clotting the benefit of anticoagulation outweighs the risk.  5.  Leukocytosis. At this time no evidence of infection, although technically the patient could meet criteria for sepsis. If he develops a fever or low  temperature we will obtain blood cultures prior to initiating antibiotics if a source becomes clear. At this time the leukocytosis seems to be due to stress demargination.  6.  Rheumatoid arthritis. Stable for now, but methotrexate may impair healing. Consider rheumatology consult if healing becomes a significant issue with this hospitalization.  7.  Osteoporosis. Continue alendronate.  8.  Anemia. Continue ferrous sulfate.  9.  Deep vein thrombosis prophylaxis.  SCDs for now. Recommend restarting treatment-dose heparin once the chest tube site is clotted.  10. Gastrointestinal prophylaxis. Continue Nexium as the patient's alendronate may cause erosive esophagitis.   The patient is currently a full code.   Time spent on orders and patient care: Approximately 45 minutes of critical care time.    ____________________________ Norva Riffle. Marcille Blanco, MD msd:lt D: 04/12/2014 02:51:18 ET T: 04/12/2014 07:11:32 ET JOB#: [id number]  cc: Norva Riffle. Marcille Blanco, MD, <Dictator> Norva Riffle Ralynn San MD ELECTRONICALLY SIGNED 04/20/2014 0:18

## 2014-10-07 NOTE — Consult Note (Signed)
PATIENT NAME:  Juan Fernandez, Juan Fernandez MR#:  [id number] DATE OF BIRTH:  05-12-1946  DATE OF CONSULTATION:  04/12/2014  REFERRING PHYSICIAN:   CONSULTING PHYSICIAN:  Iline Buchinger E. Genevive Bi, MD  REQUESTING PHYSICIAN: [name]. Burt Knack, M.D.   REASON FOR CONSULTATION: Management of right-sided chest tube.   I have personally seen and examined Juan Fernandez. I have independently reviewed his chest x-rays.   HISTORY OF PRESENT ILLNESS: Juan Fernandez is a 69 year old gentleman, who was walking down the steps when he fell yesterday, striking the right side of his lower chest. He experienced extreme chest pain. He came to the Emergency Room where a moderate to large size right-sided pneumothorax was noted. A chest tube was inserted and the patient was admitted to the hospital. He states that he still has some discomfort in that area, but he does not complain of any shortness of breath. He smoked for many years, but quit about 10 years ago. His other significant past medical history consists of an aortic dissection, type B, for which he has undergone stent grafting. This was done at [facility] over the last several years and staged procedures. The patient denies any fevers, chills or weight loss. He lives alone at home, although his daughter lives next door to him. 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   PAST MEDICAL HISTORY: Significant for his aortic dissection and repair of the thoracic aortic aneurysm. He also has a history of prostate cancer for which he has received radiation therapy. He has had hip replacement surgery on the right. He has a history of hypertension and COPD, as well.   FAMILY HISTORY: His mother had a head and neck cancer. There is no significant family history of any lung disease.   REVIEW OF SYSTEMS: As per history of present illness, and all other review of systems were asked and were negative.   PHYSICAL EXAMINATION:  GENERAL: Revealed a  thin gentleman who appeared his stated age. He was awake, alert, and oriented. He was able to speak in complete sentences.  NECK: Supple without thyromegaly or adenopathy.  LUNGS: Equal bilaterally. He did have a large dressing on his right lower chest wall, which I did not remove. There was some tenderness underneath the dressing to palpation.  HEART: Regular.  ABDOMEN: Soft and nontender.  EXTREMITIES: Revealed no clubbing, cyanosis or edema.   ASSESSMENT AND PLAN: I have independently reviewed the patient's CT scan. The chest tube is in good position. There is a trace residual amount of air in the right pleural space. Otherwise, it looks like the lung is completely expanded. There is a stent graft in place throughout the thoracic aorta, extending down to the celiac trunk. There is some, what appears to be chronic left lower lobe atelectasis and a small left pleural effusion.   At the present time, I would leave his chest tube to suction. I did not appreciate an air leak today with coughing. I will obtain another chest x-ray tomorrow.   Thank you very much for allowing me to participate in his care.    ____________________________ Lew Dawes. Genevive Bi, MD teo:JT D: 04/12/2014 10:32:05 ET T: 04/12/2014 11:39:55 ET JOB#: [id number]  cc: Christia Reading E. Genevive Bi, MD, <Dictator> Louis Matte MD ELECTRONICALLY SIGNED 04/26/2014 9:26

## 2014-10-07 NOTE — H&P (Signed)
Subjective/Chief Complaint rt PTX, fall   History of Present Illness slipped, fell two steps, no prefall dizziness, no syncope. No LOC rt chest pain and SOB no prior episode has had DVT/PE, thoracic aneurysm all care at [facility]   Past History thor aneurysm with endoluminal stents, back suregry arrhythmias, cardiologist at [facility] Hx DVT/PE difficult historian, family member helpful with some prostate cancer, rad [place] Coronary Artery Disease, Hypertension, Cancer, COPD   Past Med/Surgical Hx:  Prostate Cancer:   Rheumatoid Arthritis:   COPD:   Hypertension:   Hypercholesterolemia:   Osteoporosis:   Aortic disection:   GERD - Esophageal Reflux:   Blood Clots:   MI - Myocardial Infarct:   Hip Surgery - Right:   Hip Replacement - Right:   ALLERGIES:  Morphine Sulfate: Hallucinations  Family and Social History:  Family History Non-Contributory   Place of Living Home   Review of Systems:  ROS Pt not able to provide ROS  SOB, emergent CT   Medications/Allergies Reviewed Medications/Allergies reviewed   Physical Exam:  GEN cachectic, thin, disheveled, obvious SOB   HEENT pink conjunctivae   NECK supple   RESP postive use of accessory muscles  wheezing  no BS on rt   CARD regular rate   ABD denies tenderness   LYMPH rt neck hematoma at clavicular area   EXTR negative edema   SKIN normal to palpation   PSYCH alert, A+O to time, place, person, good insight   Additional Comments consent obtained, Rt CT placed emergently, CXR pending   Lab Results: Hepatic:  27-Oct-15 23:09   Bilirubin, Total 0.7  Alkaline Phosphatase 80 (46-116 NOTE: New Reference Range 01/03/14)  SGPT (ALT) 19 (14-63 NOTE: New Reference Range 01/03/14)  SGOT (AST) 24  Total Protein, Serum 8.0  Albumin, Serum  3.0  Routine Chem:  27-Oct-15 23:09   B-Type Natriuretic Peptide ([facility])  836 (Result(s) reported on 11 Apr 2014 at 11:41PM.)  Glucose, Serum  220  BUN   20  Creatinine (comp) 0.95  Sodium, Serum 137  Potassium, Serum 4.3  Chloride, Serum 102  CO2, Serum 28  Calcium (Total), Serum  8.3  Osmolality (calc) 283  eGFR (African American) >60  eGFR (Non-African American) >60 (eGFR values <44m/min/1.73 m2 may be an indication of chronic kidney disease (CKD). Calculated eGFR, using the MRDR Study equation, is useful in  patients with stable renal function. The eGFR calculation will not be reliable in acutely ill patients when serum creatinine is changing rapidly. It is not useful in patients on dialysis. The eGFR calculation may not be applicable to patients at the low and high extremes of body sizes, pregnant women, and vetetarians.)  Anion Gap 7  Cardiac:  27-Oct-15 23:09   Troponin I < 0.02 (0.00-0.05 0.05 ng/mL or less: NEGATIVE  Repeat testing in 3-6 hrs  if clinically indicated. >0.05 ng/mL: POTENTIAL  MYOCARDIAL INJURY. Repeat  testing in 3-6 hrs if  clinically indicated. NOTE: An increase or decrease  of 30% or more on serial  testing suggests a  clinically important change)  Routine Hem:  27-Oct-15 23:09   WBC (CBC)  17.2  RBC (CBC)  3.78  Hemoglobin (CBC)  11.5  Hematocrit (CBC)  35.4  Platelet Count (CBC) 316 (Result(s) reported on 11 Apr 2014 at 11:24PM.)  MCV 94  MCH 30.3  MCHC 32.4  RDW  17.8   Radiology Results: XRay:    27-Oct-15 23:33, Chest 1 View AP or PA  Chest 1 View AP or PA  REASON FOR EXAM:    dyspnea  COMMENTS:       PROCEDURE: DXR - DXR CHEST 1 VIEWAP OR PA  - Apr 11 2014 11:33PM     CLINICAL DATA:  Dyspnea, recent fall.    EXAM:  CHEST - 1 VIEW    COMPARISON:  12/08/2011    FINDINGS:  Moderate to large right pneumothorax. Trace right pleural effusion.  Small to moderate left pleural effusion. Associated left lung  airspace opacity. Aortic arch tortuosity. Status post descending  aortic graft. Heart size normal range. Patient is rotated, limiting  evaluation for midline shift. There  is a nondisplaced posterior  right eighth rib fracture.     IMPRESSION:  Moderate to large right pneumothorax. Cannot exclude tension given  patient rotation.    Posterior right eighth rib fracture.    Moderate left and trace rightpleural effusions. Left lung airspace  opacity, favor atelectasis.    Critical Value/emergent results were called by telephone at the time  of interpretation on 04/12/2014 at 12:20 am to Dr. Nance Pear ,  who verbally acknowledged these results.      Electronically Signed    By: Carlos Levering M.D.    On: 04/12/2014 00:26         Verified By: Tommi Rumps, M.D.,    Assessment/Admission Diagnosis rt PTX, CT placed emergently admit, pain control will ask IM/PD to assist with anticoag and other meds; Hx of VTE will ask Dr Genevive Bi top see pt; has had endoluminal repair ot TAA may need cardiol consult as well will get chest CT to assist in management decisions   Electronic Signatures: Florene Glen (MD)  (Signed 28-Oct-15 01:08)  Authored: CHIEF COMPLAINT and HISTORY, PAST MEDICAL/SURGIAL HISTORY, ALLERGIES, FAMILY AND SOCIAL HISTORY, REVIEW OF SYSTEMS, PHYSICAL EXAM, LABS, Radiology, ASSESSMENT AND PLAN   Last Updated: 28-Oct-15 01:08 by Florene Glen (MD)

## 2014-10-07 NOTE — Consult Note (Signed)
Chief Complaint:  Subjective/Chief Complaint Atrial fibrillation bradycardia with pneumothorax and chest 2 appears to be reasonably stable at this point mild chest pain   VITAL SIGNS/ANCILLARY NOTES: **Vital Signs.:   29-Oct-15 11:50  Vital Signs Type Routine  Temperature Temperature (F) 97.2  Celsius 36.2  Temperature Source oral  Pulse Pulse 64  Respirations Respirations 18  Systolic BP Systolic BP 623  Diastolic BP (mmHg) Diastolic BP (mmHg) 55  Mean BP 72  Pulse Ox % Pulse Ox % 99  Pulse Ox Activity Level  At rest  *Intake and Output.:   29-Oct-15 08:00  Grand Totals Intake:  0 Output:      Net:  0 24 Hr.:  -275  Oral Intake      In:  0  Percentage of Meal Eaten  100   Brief Assessment:  GEN well developed, well nourished, no acute distress   Cardiac Irregular  -- JVD   Respiratory normal resp effort  clear BS  chest tube in place   Gastrointestinal Normal   Gastrointestinal details normal Soft  Nontender  Nondistended   EXTR negative cyanosis/clubbing, negative edema   Lab Results: Cardiology:  28-Oct-15 02:11   Ventricular Rate 46  Atrial Rate 46  P-R Interval 184  QRS Duration 78  QT 514  QTc 449  P Axis 53  R Axis 35  T Axis 32  ECG interpretation Marked sinus bradycardia with Premature atrial complexes Low voltage QRS Abnormal ECG When compared with ECG of 11-Apr-2014 23:05, Sinus rhythm has replaced Atrial fibrillation Vent. rate has decreased BY  46 BPM ----------unconfirmed---------- Confirmed by OVERREAD, NOT ([id number]), editor PEARSON, BARBARA ([id number]) on 04/13/2014 9:44:23 AM    04:04   Ventricular Rate 46  Atrial Rate 46  P-R Interval 180  QRS Duration 86  QT 500  QTc 437  P Axis 67  R Axis 39  T Axis 45  ECG interpretation Sinus bradycardia Low voltage QRS Borderline ECG When compared with ECG of 12-Apr-2014 02:11, No significant change was found Confirmed by CALLWOOD, DWAYNE ([id number]) on 04/13/2014 9:13:42 AM  Overreader: Lujean Amel  Routine Chem:  28-Oct-15 04:17   Potassium, Serum 4.4 (Result(s) reported on 12 Apr 2014 at 04:35AM.)  29-Oct-15 04:26   Creatinine (comp) 0.89  eGFR (African American) >60  eGFR (Non-African American) >60 (eGFR values <47m/min/1.73 m2 may be an indication of chronic kidney disease (CKD). Calculated eGFR, using the MRDR Study equation, is useful in  patients with stable renal function. The eGFR calculation will not be reliable in acutely ill patients when serum creatinine is changing rapidly. It is not useful in patients on dialysis. The eGFR calculation may not be applicable to patients at the low and high extremes of body sizes, pregnant women, and vetetarians.)  Hemoglobin A1c ([facility]) 4.8 (The American Diabetes Association recommends that a primary goal of therapy should be <7% and that physicians should reevaluate the treatment regimen in patients with HbA1c values consistently >8%.)   Radiology Results: XRay:    27-Oct-15 23:33, Chest 1 View AP or PA  Chest 1 View AP or PA   REASON FOR EXAM:    dyspnea  COMMENTS:       PROCEDURE: DXR - DXR CHEST 1 VIEWAP OR PA  - Apr 11 2014 11:33PM     CLINICAL DATA:  Dyspnea, recent fall.    EXAM:  CHEST - 1 VIEW    COMPARISON:  12/08/2011    FINDINGS:  Moderate to large right pneumothorax. Trace  right pleural effusion.  Small to moderate left pleural effusion. Associated left lung  airspace opacity. Aortic arch tortuosity. Status post descending  aortic graft. Heart size normal range. Patient is rotated, limiting  evaluation for midline shift. There is a nondisplaced posterior  right eighth rib fracture.     IMPRESSION:  Moderate to large right pneumothorax. Cannot exclude tension given  patient rotation.    Posterior right eighth rib fracture.    Moderate left and trace rightpleural effusions. Left lung airspace  opacity, favor atelectasis.    Critical Value/emergent results were called by telephone at the  time  of interpretation on 04/12/2014 at 12:20 am to Dr. Nance Pear ,  who verbally acknowledged these results.      Electronically Signed    By: Carlos Levering M.D.    On: 04/12/2014 00:26         Verified By: Tommi Rumps, M.D.,    28-Oct-15 00:59, Chest Portable Single View  Chest Portable Single View   REASON FOR EXAM:    post chest tube insertion  COMMENTS:       PROCEDURE: DXR - DXR PORTABLE CHEST SINGLE VIEW  - Apr 12 2014 12:59AM     CLINICAL DATA:  Status post right chest tube insertion    EXAM:  PORTABLE CHEST - 1 VIEW    COMPARISON:  04/01/2014    FINDINGS:  Re- expansion of the right lung status post chest tube insertion.  Tiny residual amount of pleural air. Small amount of subcutaneous  air along the periphery of the right hemithorax. Otherwise, no  change. Posterior right eighth and ninth rib fractures.     IMPRESSION:  Interval right chest tube insertion and lung re-expansion with tiny  residual amount of pleural air.    Posterior right eighth and ninth rib fractures.    Trace right pleural effusion. Small to moderate left pleural  effusion. Associated airspace opacities, favor atelectasis.      Electronically Signed    By: Carlos Levering M.D.    On: 04/12/2014 01:42         Verified By: Tommi Rumps, M.D.,    29-Oct-15 05:59, Chest Portable Single View  Chest Portable Single View   REASON FOR EXAM:    assess for pleural effusion  COMMENTS:       PROCEDURE: DXR - DXR PORTABLE CHEST SINGLE VIEW  - Apr 13 2014  5:59AM     CLINICAL DATA:  Right chest tube, followup    EXAM:  PORTABLE CHEST - 1 VIEW    COMPARISON:  CT chest of 04/12/2014 and portable chest x-ray of the  same date    FINDINGS:  A right chest tube remains and no definite right pneumothorax is  seen. There has been and increase in haziness at both lung bases  consistent with effusions and atelectasis. Pneumonia at the lung  bases cannot be excluded.  Mild cardiomegaly is stable. The lungs are  not as well aerated.     IMPRESSION:  Diminished aeration with increase in bibasilar opacities consistent  with atelectasis, effusions, and possibly pneumonia. A right chest  tube remains with no definite pneumothorax.      Electronically Signed    By: Ivar Drape M.D.    On: 04/13/2014 08:03     Verified By: Joretta Bachelor, M.D.,    30-Oct-15 08:18, Chest PA and Lateral  Chest PA and Lateral   REASON FOR EXAM:    assess  for pleural effusion  COMMENTS:       PROCEDURE: DXR - DXR CHEST PA (OR AP) AND LATERAL  - Apr 14 2014  8:18AM     CLINICAL DATA:  Known rib fractures, pleural effusion    EXAM:  CHEST  2 VIEW    COMPARISON:  04/13/2014    FINDINGS:  Aortic stent graft again noted. Cardiomediastinal silhouette is  stable. Bilateral small pleural effusion with bilateral basilar  atelectasis or infiltrate. Stable right chest tube position. No  pulmonary edema. Thoracic spine osteopenia. Stable compression  deformity mid thoracic spine.     IMPRESSION:  No pulmonary edema. Bilateral small pleural effusion with bilateral  basilar atelectasis or infiltrate. Stable right chest tube position.      Electronically Signed    By: Lahoma Crocker M.D.    On: 04/14/2014 09:01         Verified By: Ephraim Hamburger, M.D.,    30-Oct-15 10:49, Chest PA and Lateral  Chest PA and Lateral   REASON FOR EXAM:    assess for pleural effusion  COMMENTS:       PROCEDURE: DXR - DXR CHEST PA (OR AP) AND LATERAL  - Apr 14 2014 10:49AM     CLINICAL DATA:  RIGHT side chest tube removal    EXAM:  CHEST  2 VIEW    COMPARISON:  Exam at 1042 hr compared to 0803 hr    FINDINGS:  RIGHT thoracostomy tube no longer identified.  No pneumothorax following RIGHT chest tube removal.    Persistent bibasilar atelectasis and small pleural effusions.    Nodular density versus atelectasis at RIGHT lung base.    Upper lungs clear.    Prior endo stenting of the  descending thoracic and abdominal aorta.    Fractures of the posterior RIGHT fourth and fifth ribs noted.     IMPRESSION:  No pneumothorax following chest tube removal.  Persistent bibasilar atelectasis and small pleural effusions.    Questionable nodular density approximately 13 mm diameter versus  atelectasis at RIGHT lung base, without definite lung nodule seen on  recent chest CT at this position; recommend attention on follow-up  exams.      Electronically Signed    By: Lavonia Dana M.D.    On: 04/14/2014 11:26         Verified By: Burnetta Sabin, M.D.,  Cardiology:    27-Oct-15 23:05, ECG  Ventricular Rate 92  Atrial Rate 57  QRS Duration 94  QT 364  QTc 450  R Axis 49  T Axis 36  ECG interpretation   Atrial fibrillation  Abnormal ECG  When compared with ECG of 19-Jul-2010 00:24,  Atrial fibrillation has replaced Sinus rhythm  T wave amplitude has increased in Lateral leads  QT has lengthened  ----------unconfirmed----------  Confirmed by OVERREAD, NOT ([id number]), editor PEARSON, BARBARA ([id number]) on 04/13/2014 9:44:05 AM  ECG     28-Oct-15 02:11, ECG  Ventricular Rate 46  Atrial Rate 46  P-R Interval 184  QRS Duration 78  QT 514  QTc 449  P Axis 53  R Axis 35  T Axis 32  ECG interpretation   Marked sinus bradycardia with Premature atrial complexes  Low voltage QRS  Abnormal ECG  When compared with ECG of 11-Apr-2014 23:05,  Sinus rhythm has replaced Atrial fibrillation  Vent. rate has decreased BY  46 BPM  ----------unconfirmed----------  Confirmed by OVERREAD, NOT ([id number]), editor PEARSON, BARBARA ([id number]) on 04/13/2014 9:44:23 AM  ECG  28-Oct-15 04:04, ECG  Ventricular Rate 46  Atrial Rate 46  P-R Interval 180  QRS Duration 86  QT 500  QTc 437  P Axis 67  R Axis 39  T Axis 45  ECG interpretation   Sinus bradycardia  Low voltage QRS  Borderline ECG  When compared with ECG of 12-Apr-2014 02:11,  No significant change was found  Confirmed by CALLWOOD,  DWAYNE ([id number]) on 04/13/2014 9:13:42 AM    Overreader: Lujean Amel  ECG   CT:    28-Oct-15 09:20, CT Chest With Contrast  CT Chest With Contrast   REASON FOR EXAM:    rt PTX, COPD, TAA repaired  COMMENTS:       PROCEDURE: CT  - CT CHEST WITH CONTRAST  - Apr 12 2014  9:20AM     CLINICAL DATA:  Right upper chest pain.  Right-sided pneumothorax.    EXAM:  CT CHEST WITH CONTRAST    TECHNIQUE:  Multidetector CT imaging of the chest was performed during  intravenous contrast administration.    CONTRAST:  70 mL of Isovue 370 intravenously.  COMPARISON:  Chest radiograph of same day.    FINDINGS:  Mild emphysematous changes noted in the upper lobesbilaterally.  Hyperexpansion of the lungs is noted. Right-sided chest tube is  noted with minimal apical pneumothorax seen. The tip of the chest  tube appears to be in the inferior portion of the right major  fissure. Minimal subcutaneous emphysema is noted laterally. Mild  subsegmental atelectasis is noted posteriorly in the right lower  lobe. 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 There is no evidence of thoracic aortic dissection. No  endoleak is noted. The graft appears to be widely patent. Great  vessels appear to be widely patent. No mediastinal mass or  adenopathy is noted. Mild loculated effusion is seen posteriorly and  laterally in the left lung base. There is adjacent atelectasis in  the left lower lobe. Mildly displaced fracture is seen involving  medial head of right clavicle. Mildly displaced and comminuted  fracture is seen involving the posterior portion of a right lower  rib.     IMPRESSION:  Right-sided chest tube is noted with minimal right apical  pneumothorax seen.    Mild subsegmental atelectasis is noted posteriorly in the right lung  base.    Mild loculated pleural effusion is noted in left lung base with  adjacent subsegmental atelectasis.  Status post stent graft repair  of descending thoracic aortic  aneurysm. The graft is widely patent with no evidence of endoleak.    Mildly displaced oblique fracture is seen involving the medial head  of the right clavicle. Mildly displaced and comminuted fracture is  seen involving the posterior portion of a right lower rib.      Electronically Signed    By: Sabino Dick M.D.    On: 04/12/2014 09:51         Verified By: Marveen Reeks, M.D.,   Assessment/Plan:  Assessment/Plan:  Assessment IMP  pneumothorax  pneumonia  GERD  atrial fibrillation  bradycardia  rheumatoid arthritis .   Plan PLAN  continue chest tube  antibiotic therapy for possible infection and pneumonia  continue telemetry for bradycardia in AFib  consider long-term anticoagulation for AFib  do not recommend permanent pacemaker for bradycardia consider omeprazole for GERD and reflux symptoms echocardiogram for evaluation   Electronic Signatures: Yolonda Kida (MD)  (Signed 11-Nov-15 15:37)  Authored: Chief Complaint, VITAL SIGNS/ANCILLARY  NOTES, Brief Assessment, Lab Results, Radiology Results, Assessment/Plan   Last Updated: 11-Nov-15 15:37 by Lujean Amel D (MD)

## 2014-10-07 NOTE — H&P (Signed)
PATIENT NAME:  Juan Fernandez, EDELSON MR#:  [id number] DATE OF BIRTH:  [birth year]  CHIEF COMPLAINT: Chest pain and shortness of breath.   HISTORY OF PRESENT ILLNESS: This is a 68 year old male patient who presented to the Emergency Room with shortness of breath following a fall. He fell down 2 steps. He was not syncopal. Did not become dizzy. He has never had an episode like this before. He states that he just slipped,  did not lose consciousness, has no pain anyplace, but his chest on the right side. A work-up has shown a right pneumothorax for which a chest tube has been placed. See my brief operative report for details.  PAST MEDICAL HISTORY: Prostate cancer, COPD, DVT, and pulmonary embolus, coronary artery disease, thoracoabdominal aneurysm, with endoluminal repair, myocardial infarction.   PAST SURGICAL HISTORY: Endoluminal stent placement in the iliacs as well as the   thoracoabdominal area for TAA. He has also had a right hip replacement and recent chest tube placement by me personally.   SOCIAL HISTORY: The patient stopped smoking many years ago. Does not drink alcohol.   REVIEW OF SYSTEMS:  A 10-system review was attempted, but made difficult by his shortness of breath and discomfort.   FAMILY HISTORY: Noncontributory.   MEDICATIONS: Multiple, see chart.   ALLERGIES: MORPHINE.   PHYSICAL EXAMINATION: GENERAL: Very uncomfortable-appearing, thin male patient. He is using his accessory  respiratory muscles.  VITAL SIGNS: Temperature is 96.7, pulse of 78, respirations 24, blood pressure 100/74, 95% saturation on supplemental oxygen.  HEENT: Shows poor dentition.  NECK: No palpable neck nodes.  CHEST: Showing poor breath sounds on the right prior to chest tube placement. Good breath sounds following chest tube placement on the right. No crepitus is noted.  CARDIAC: Regular rate and rhythm.  ABDOMEN: Soft, nontender.  EXTREMITIES: Without edema.  NEUROLOGIC: Grossly intact.  INTEGUMENT:  Shows no jaundice.   IMAGING:  Chest x-ray was personally reviewed. Again, chest tube has been placed.   LABORATORY VALUES: Have been reviewed as well.   ASSESSMENT AND PLAN: This is a patient with multiple medical problems and multiple prior procedures. He will be admitted for chest tube management. I will ask Dr. Marta Lamas to see the patient in the morning and I have discussed him with the internal medicine consult who will assist in managing his multiple medical problems. I will also order a CT scan to evaluate his chest for the morning.    ____________________________ [name]. Burt Knack, MD rec:LT D: 04/12/2014 21:02:21 ET T: 04/12/2014 21:19:44 ET JOB#: [id number]  cc: [name]. Burt Knack, MD, <Dictator> Florene Glen MD ELECTRONICALLY SIGNED 04/13/2014 20:12

## 2014-10-07 NOTE — Consult Note (Signed)
PATIENT NAME:  Juan Fernandez, STREATER MR#:  [id number] DATE OF BIRTH:  06-05-1946  DATE OF CONSULTATION:  04/12/2014  REFERRING PHYSICIAN:    Rosilyn Mings, MD,  Dr. Burt Knack CONSULTING PHYSICIAN:  Shelvy Perazzo D. Clayborn Bigness, MD  PRIMARY PHYSICIAN:   [name], MD  INDICATION: Bradycardia, history of atrial fibrillation.   HISTORY OF PRESENT ILLNESS: Juan Fernandez is a 69 year old white male with a history of atrial fibrillation treated at [facility] with cardioversion as well as   He recently presented with shortness of breath. Recently had a fall at home on the steps, landed on the right side. Had gotten progressively short of breath so came in for evaluation and was found to have a pneumothorax and fractured ribs. Placed on BiPAP. Subsequently had chest tube placed.  Was found to be in significant bradycardia since and now cardiology consultation is recommended for further management.  Denied syncope or blackout spells.  He said slipped and he fell on wet steps.   REVIEW OF SYSTEMS: Denies blackout as well as syncope. Denies nausea or vomiting. No fever. No chills. No sweats. No weight loss. No weight gain. No hemoptysis .  Denies   change in hearing, change  .  Complains of right-sided soreness after the fall.  He has had some shortness of breath  No significant sputum production.   PAST MEDICAL HISTORY:  Prostate cancer.  He has had brachytherapy, COPD, DVT with  pulmonary emboli, recurrent falls, memory loss, rheumatoid arthritis, osteoporosis, lung nodule, peripheral vascular disease.   PAST SURGICAL HISTORY: Right hip replacement, thoracic aneurysm repair, femoral and iliac stents placed.   SOCIAL HISTORY: Quit smoking 10 years ago. No alcohol consumption. Lives alone.  His daughter lives close by.  Retired Nature conservation officer.   FAMILY HISTORY: Lymphoma, hypertension.  MEDICATIONS:  Alendronate 70 mg once a week, aspirin 81 mg a day, calcium carbonate  1200 mg twice a day, Cholecalciferol 1000 units daily,  Dulcolax 100 mg twice a day,  500 mg twice a day,  mg twice a day, enalapril 5 mg half tablet twice a day, iron sulfate 325 once a day, finasteride 5 mg once a day, Flonase nasal spray twice a day, folic acid 1 mg a day, mag oxide 400 mg once a day, methotrexate 2.5 mg once weekly, metoprolol 50 mg 1/2 tablet twice a day, Nascobol 500 mcg in 0.1 mL nasal spray once weekly, Nexium 40 mg a day, oxycodone 5 mg 1 to 2 tabs p.r.n., simvastatin 20 mg a day, Spiriva 18 mcg daily, B12 spray once a day, Zyloprim 10 mg at bedtime as needed.   ALLERGIES:  MORPHINE SULFATE.  LABORATORIES:  Chest x-ray shows significant for pneumothorax.  EKG: Sinus bradycardia at 40.   Creatinine 0.95, sodium 137, potassium 4.3, chloride of 102, bicarb 28, bilirubin 0.9. LFTs negative. White count 17.2, hemoglobin 11.5, hematocrit 35. BNP 836.   PHYSICAL EXAMINATION: VITAL SIGNS: Blood pressure 110/55, pulse of 80, respiratory rate of 16, afebrile.  HEENT: Normocephalic, atraumatic. Pupils equal, reactive to light.   NECK: Supple. No significant JVD, bruits or adenopathy.  LUNGS:  Clear with chest tube in place on the right.  Adequate air movement. No rales or rhonchi. HEART:  Bradycardic. Systolic ejection murmur at the apex. PMI nondisplaced.   ABDOMEN: Benign.  EXTREMITIES:  Within normal limits.  NEUROLOGIC: Intact.  SKIN: Normal.   ASSESSMENT:  Traumatic pneumothorax, fractured ribs, chronic obstructive pulmonary disease, bradycardia, history of atrial fibrillation, history of deep vein thrombosis, peripheral vascular disease,  status post thoracic aneurysm repair. Leukocytosis unclear etiology, possibly related to trauma.  Osteoporosis, anemia, gastroesophageal reflux disease.   PLAN:  Agree with admit. Continue chest tube placement   surgery. Agree with management and control of his respiratory status with chest tube once the pneumothorax has healed and consider discontinuing chest tube.   2.   Will continue pain  control for fractured ribs.   3.  Bradycardia: Consider reducing metoprolol 25 twice a day. We are    12.5 twice a day  4.  Leukocytosis.  Consider antibiotic therapy from the recent trauma and possible infection. 5.  Rheumatoid arthritis.  Continue methotrexate therapy. 6.  Osteoporosis. Continue vitamin C and calcium. 7.  Gastroesophageal reflux disease.  Continue Nexium.   8.  Deep vein thrombosis prophylaxis.  He is on Eliquis.   9.  No evidence of recurrent atrial fibrillation. Continue  and Eliquis for now. I do not see any clear indication for a permanent pacemaker. Continue current therapy. Will consider decreasing metoprolol until the patient is able to  follow up with his cardiologist at [facility].    I will continue to follow.  ____________________________ Loran Senters Clayborn Bigness, MD [id number]:jw D: 04/12/2014 17:55:45 ET T: 04/12/2014 20:26:35 ET JOB#: [id number]  cc: Kathie Posa D. Clayborn Bigness, MD, <Dictator> Yolonda Kida MD ELECTRONICALLY SIGNED 05/15/2014 9:58

## 2014-10-07 NOTE — Consult Note (Signed)
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   Patient was seen by consultant.   Consult note dictated.   Recommend further assessment or treatment.   Orders entered.   Discussed with Attending MD.   Comments: D/c BiPAP (no positive pressure w/ pntx), added inahlers to regimen. Monitor brady (asymptomatic); change metoprolol to tartrate and decrease dose. BP low-normal (no evidence of tension pneumotx at this time); cont enalapril as low dose but d/c if pressure continues to decrease. Cont anticoagulation due to recurrent DVT/PE but switch to heparin b/c reversible in event of urgent surgical need-- monitor chest tube output for blood.  Electronic Signatures: Harrie Foreman (MD)  (Signed 28-Oct-15 04:37)  Authored: Brief Consult Note   Last Updated: 28-Oct-15 04:37 by Harrie Foreman (MD)

## 2014-10-07 NOTE — Consult Note (Signed)
Brief Consult Note: Diagnosis: traumatic right pneumothorax secondary to fall.   Patient was seen by consultant.   Consult note dictated.   Comments: CT scan shows trace residual pneumothorax.  Would leave chest tube to suction for now.  Please check daily portable chest XRays.  Will follow with you.  Electronic Signatures: Louis Matte (MD)  (Signed 28-Oct-15 10:26)  Authored: Brief Consult Note   Last Updated: 28-Oct-15 10:26 by Louis Matte (MD)

## 2014-10-09 DIAGNOSIS — M810 Age-related osteoporosis without current pathological fracture: Secondary | ICD-10-CM | POA: Diagnosis not present

## 2014-10-09 DIAGNOSIS — Z885 Allergy status to narcotic agent status: Secondary | ICD-10-CM | POA: Diagnosis not present

## 2014-10-09 DIAGNOSIS — M069 Rheumatoid arthritis, unspecified: Secondary | ICD-10-CM | POA: Diagnosis not present

## 2014-10-09 DIAGNOSIS — J449 Chronic obstructive pulmonary disease, unspecified: Secondary | ICD-10-CM | POA: Diagnosis not present

## 2014-10-09 DIAGNOSIS — Z7901 Long term (current) use of anticoagulants: Secondary | ICD-10-CM | POA: Diagnosis not present

## 2014-10-09 DIAGNOSIS — I10 Essential (primary) hypertension: Secondary | ICD-10-CM | POA: Diagnosis not present

## 2014-10-09 DIAGNOSIS — I714 Abdominal aortic aneurysm, without rupture: Secondary | ICD-10-CM | POA: Diagnosis not present

## 2014-10-09 DIAGNOSIS — I4891 Unspecified atrial fibrillation: Secondary | ICD-10-CM | POA: Diagnosis not present

## 2014-10-09 DIAGNOSIS — I480 Paroxysmal atrial fibrillation: Secondary | ICD-10-CM | POA: Diagnosis not present

## 2014-11-07 DIAGNOSIS — J449 Chronic obstructive pulmonary disease, unspecified: Secondary | ICD-10-CM | POA: Diagnosis not present

## 2014-11-07 DIAGNOSIS — M810 Age-related osteoporosis without current pathological fracture: Secondary | ICD-10-CM | POA: Diagnosis not present

## 2014-11-07 DIAGNOSIS — M0579 Rheumatoid arthritis with rheumatoid factor of multiple sites without organ or systems involvement: Secondary | ICD-10-CM | POA: Diagnosis not present

## 2014-11-14 DIAGNOSIS — Z87891 Personal history of nicotine dependence: Secondary | ICD-10-CM | POA: Diagnosis not present

## 2014-11-14 DIAGNOSIS — I2510 Atherosclerotic heart disease of native coronary artery without angina pectoris: Secondary | ICD-10-CM | POA: Diagnosis not present

## 2014-11-14 DIAGNOSIS — Z09 Encounter for follow-up examination after completed treatment for conditions other than malignant neoplasm: Secondary | ICD-10-CM | POA: Diagnosis not present

## 2014-11-14 DIAGNOSIS — Z006 Encounter for examination for normal comparison and control in clinical research program: Secondary | ICD-10-CM | POA: Diagnosis not present

## 2014-11-14 DIAGNOSIS — E785 Hyperlipidemia, unspecified: Secondary | ICD-10-CM | POA: Diagnosis not present

## 2014-11-14 DIAGNOSIS — I716 Thoracoabdominal aortic aneurysm, without rupture: Secondary | ICD-10-CM | POA: Diagnosis not present

## 2014-11-14 DIAGNOSIS — I10 Essential (primary) hypertension: Secondary | ICD-10-CM | POA: Diagnosis not present

## 2014-12-25 ENCOUNTER — Ambulatory Visit
Admission: RE | Admit: 2014-12-25 | Discharge: 2014-12-25 | Disposition: A | Discharge status: Home / Self Care | Payer: TRICARE For Life (TFL) | Source: Ambulatory Visit | Attending: Radiation Oncology | Admitting: Radiation Oncology

## 2014-12-25 ENCOUNTER — Inpatient Hospital Stay: Payer: Medicare Other | Attending: Radiation Oncology

## 2014-12-25 ENCOUNTER — Encounter: Payer: Self-pay | Admitting: Radiation Oncology

## 2014-12-25 ENCOUNTER — Other Ambulatory Visit: Payer: Self-pay | Admitting: *Deleted

## 2014-12-25 VITALS — BP 133/61 | HR 67 | Temp 96.3°F | Resp 18 | Wt 140.2 lb

## 2014-12-25 DIAGNOSIS — C61 Malignant neoplasm of prostate: Secondary | ICD-10-CM

## 2014-12-25 LAB — PSA: PSA: 0.18 ng/mL (ref 0.00–4.00)

## 2014-12-25 NOTE — Progress Notes (Signed)
Radiation Oncology Follow up Note  Name: Juan Fernandez   Date:   12/25/2014 MRN:  [id number] DOB: [birth year]    This 69 y.o. male presents to the clinic today for follow-up for prostate cancer stage IIa presenting the PSA 5.7 and a Gleason 7 (3+4).  REFERRING PROVIDER: Birdie Sons, MD  HPI: patient is an 69 year old male now out over 2 years having completed radiation therapy for Gleason 7 (3+4) presenting the PSA 5.7. He was treated with image guided I MRT radiation therapy. He is doing well last PSA 1 year prior was 0.2. He specifically denies diarrhea dysuria or any other GI/GU complaints..  COMPLICATIONS OF TREATMENT: none  FOLLOW UP COMPLIANCE: keeps appointments   PHYSICAL EXAM:  BP 133/61 mmHg  Pulse 67  Temp(Src) 96.3 F (35.7 C)  Resp 18  Wt 140 lb 3.4 oz (63.6 kg) On rectal exam rectal sphincter tone is good. Prostate is smooth contracted without evidence of nodularity or mass. Sulcus is preserved bilaterally. No discrete nodularity is identified. No other rectal abnormalities are noted. Well-developed well-nourished patient in NAD. HEENT reveals PERLA, EOMI, discs not visualized.  Oral cavity is clear. No oral mucosal lesions are identified. Neck is clear without evidence of cervical or supraclavicular adenopathy. Lungs are clear to A&P. Cardiac examination is essentially unremarkable with regular rate and rhythm without murmur rub or thrill. Abdomen is benign with no organomegaly or masses noted. Motor sensory and DTR levels are equal and symmetric in the upper and lower extremities. Cranial nerves II through XII are grossly intact. Proprioception is intact. No peripheral adenopathy or edema is identified. No motor or sensory levels are noted. Crude visual fields are within normal range.   RADIOLOGY RESULTS: no current radiology reports to review  PLAN: I have run a PSA level on him today and will report that separately. Otherwise I'm please was overall progress. I've  asked to see him back in 1 year for follow-up. Patient is to call sooner with any concerns.  I would like to take this opportunity for allowing me to participate in the care of your patient.Armstead Peaks., MD

## 2015-01-30 DIAGNOSIS — M47819 Spondylosis without myelopathy or radiculopathy, site unspecified: Secondary | ICD-10-CM | POA: Diagnosis not present

## 2015-01-30 DIAGNOSIS — J90 Pleural effusion, not elsewhere classified: Secondary | ICD-10-CM | POA: Diagnosis not present

## 2015-01-30 DIAGNOSIS — Z96641 Presence of right artificial hip joint: Secondary | ICD-10-CM | POA: Diagnosis not present

## 2015-01-30 DIAGNOSIS — J9811 Atelectasis: Secondary | ICD-10-CM | POA: Diagnosis not present

## 2015-01-30 DIAGNOSIS — I723 Aneurysm of iliac artery: Secondary | ICD-10-CM | POA: Diagnosis not present

## 2015-01-30 DIAGNOSIS — I2510 Atherosclerotic heart disease of native coronary artery without angina pectoris: Secondary | ICD-10-CM | POA: Diagnosis not present

## 2015-01-30 DIAGNOSIS — Z006 Encounter for examination for normal comparison and control in clinical research program: Secondary | ICD-10-CM | POA: Diagnosis not present

## 2015-01-30 DIAGNOSIS — E041 Nontoxic single thyroid nodule: Secondary | ICD-10-CM | POA: Diagnosis not present

## 2015-01-30 DIAGNOSIS — I10 Essential (primary) hypertension: Secondary | ICD-10-CM | POA: Diagnosis not present

## 2015-01-30 DIAGNOSIS — Z09 Encounter for follow-up examination after completed treatment for conditions other than malignant neoplasm: Secondary | ICD-10-CM | POA: Diagnosis not present

## 2015-01-30 DIAGNOSIS — Z87891 Personal history of nicotine dependence: Secondary | ICD-10-CM | POA: Diagnosis not present

## 2015-01-30 DIAGNOSIS — Z79899 Other long term (current) drug therapy: Secondary | ICD-10-CM | POA: Diagnosis not present

## 2015-01-30 DIAGNOSIS — E785 Hyperlipidemia, unspecified: Secondary | ICD-10-CM | POA: Diagnosis not present

## 2015-01-30 DIAGNOSIS — I714 Abdominal aortic aneurysm, without rupture: Secondary | ICD-10-CM | POA: Diagnosis not present

## 2015-01-30 DIAGNOSIS — I712 Thoracic aortic aneurysm, without rupture: Secondary | ICD-10-CM | POA: Diagnosis not present

## 2015-01-30 DIAGNOSIS — I82429 Acute embolism and thrombosis of unspecified iliac vein: Secondary | ICD-10-CM | POA: Diagnosis not present

## 2015-01-30 DIAGNOSIS — I716 Thoracoabdominal aortic aneurysm, without rupture: Secondary | ICD-10-CM | POA: Diagnosis not present

## 2015-02-01 ENCOUNTER — Other Ambulatory Visit: Payer: Self-pay | Admitting: Family Medicine

## 2015-02-07 DIAGNOSIS — M0579 Rheumatoid arthritis with rheumatoid factor of multiple sites without organ or systems involvement: Secondary | ICD-10-CM | POA: Diagnosis not present

## 2015-02-07 DIAGNOSIS — M810 Age-related osteoporosis without current pathological fracture: Secondary | ICD-10-CM | POA: Diagnosis not present

## 2015-02-07 DIAGNOSIS — J449 Chronic obstructive pulmonary disease, unspecified: Secondary | ICD-10-CM | POA: Diagnosis not present

## 2015-02-16 ENCOUNTER — Other Ambulatory Visit: Payer: Self-pay | Admitting: Family Medicine

## 2015-03-10 IMAGING — CR DG CHEST 1V PORT
1 series · 1 of 1 positions shown · non-contrast
Comparison: 04/01/2014

CLINICAL DATA: Status post right chest tube insertion

EXAM:
PORTABLE CHEST - 1 VIEW

[ap]
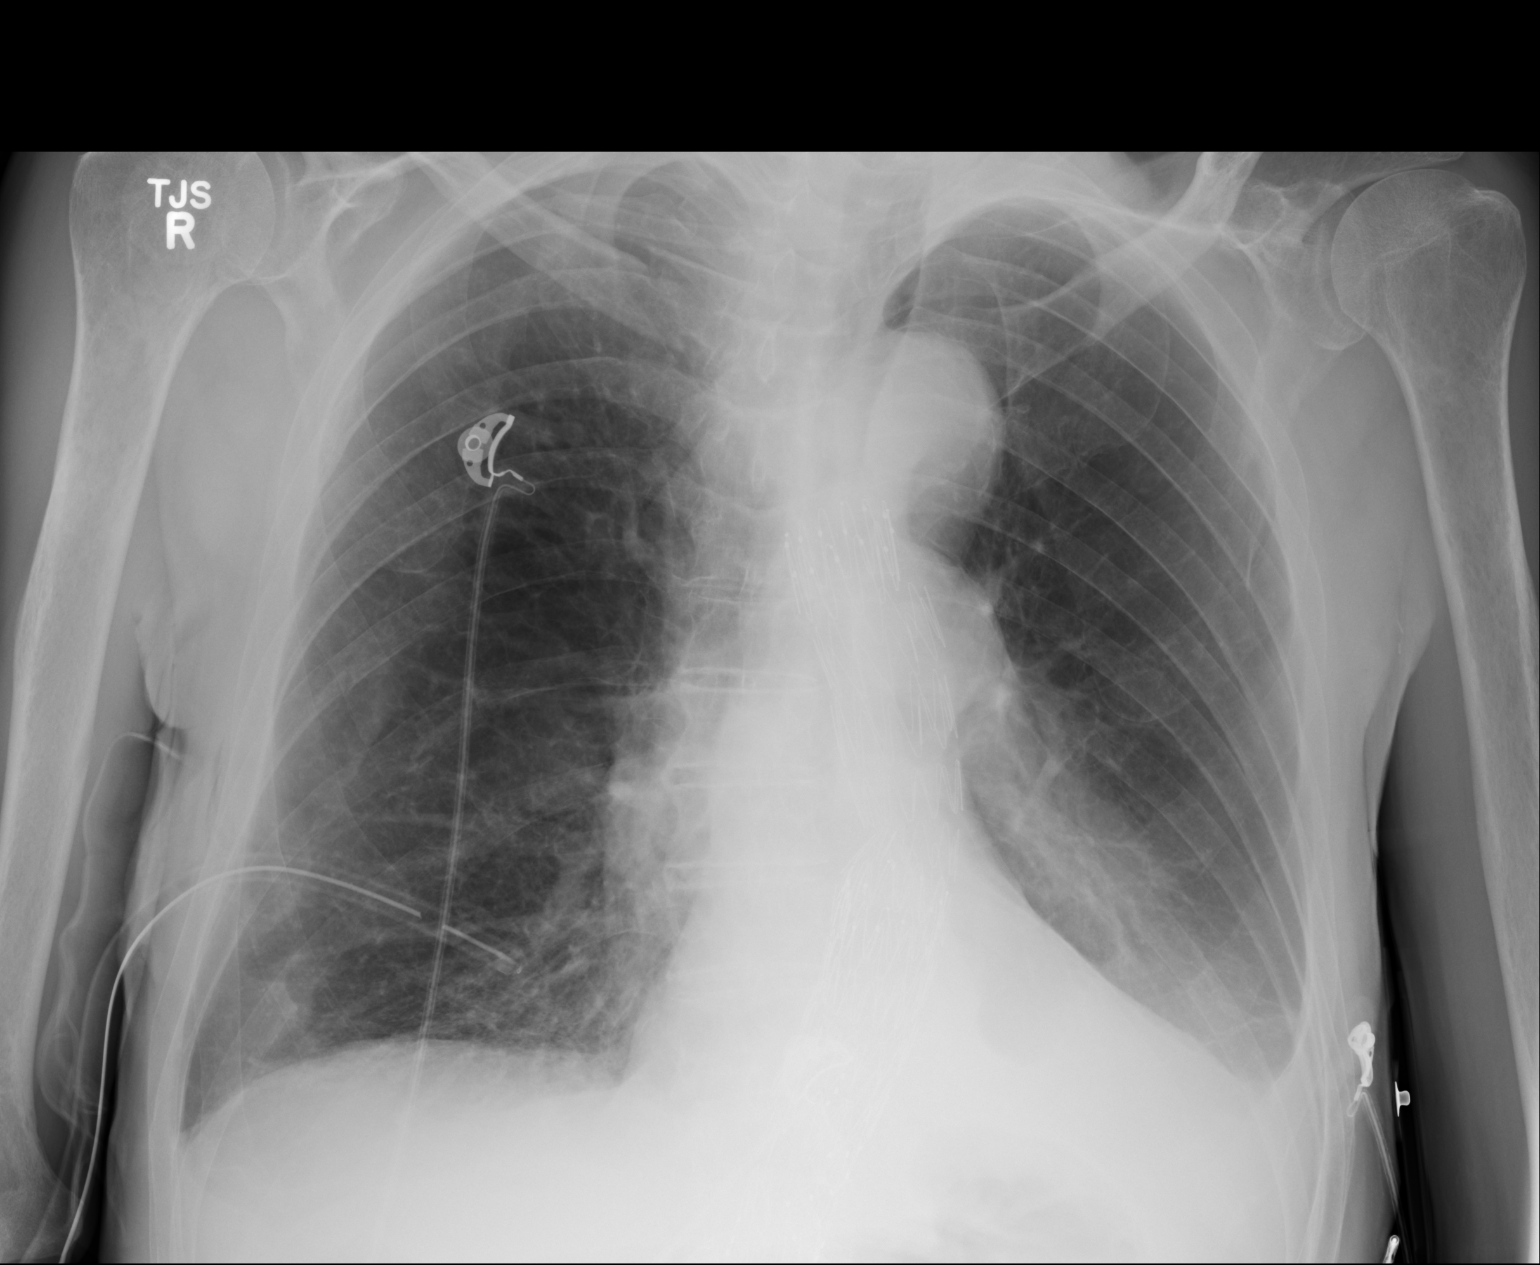

[1 of 1 positions shown; findings below may reference images not displayed]

FINDINGS: Re- expansion of the right lung status post chest tube insertion.
Tiny residual amount of pleural air. Small amount of subcutaneous
air along the periphery of the right hemithorax. Otherwise, no
change. Posterior right eighth and ninth rib fractures.
IMPRESSION: Interval right chest tube insertion and lung re-expansion with tiny
residual amount of pleural air.

Posterior right eighth and ninth rib fractures.

Trace right pleural effusion. Small to moderate left pleural
effusion. Associated airspace opacities, favor atelectasis.

## 2015-03-12 IMAGING — CR DG CHEST 2V
1 series · 2 of 2 positions shown · non-contrast
Comparison: Exam at 3782 hr compared to 5357 hr

CLINICAL DATA: RIGHT side chest tube removal

EXAM:
CHEST  2 VIEW

[Series 1: dxr chest pa (or ap) and lateral · 0.14mm/px · 2 of 2 slices shown]
[im 1/2]
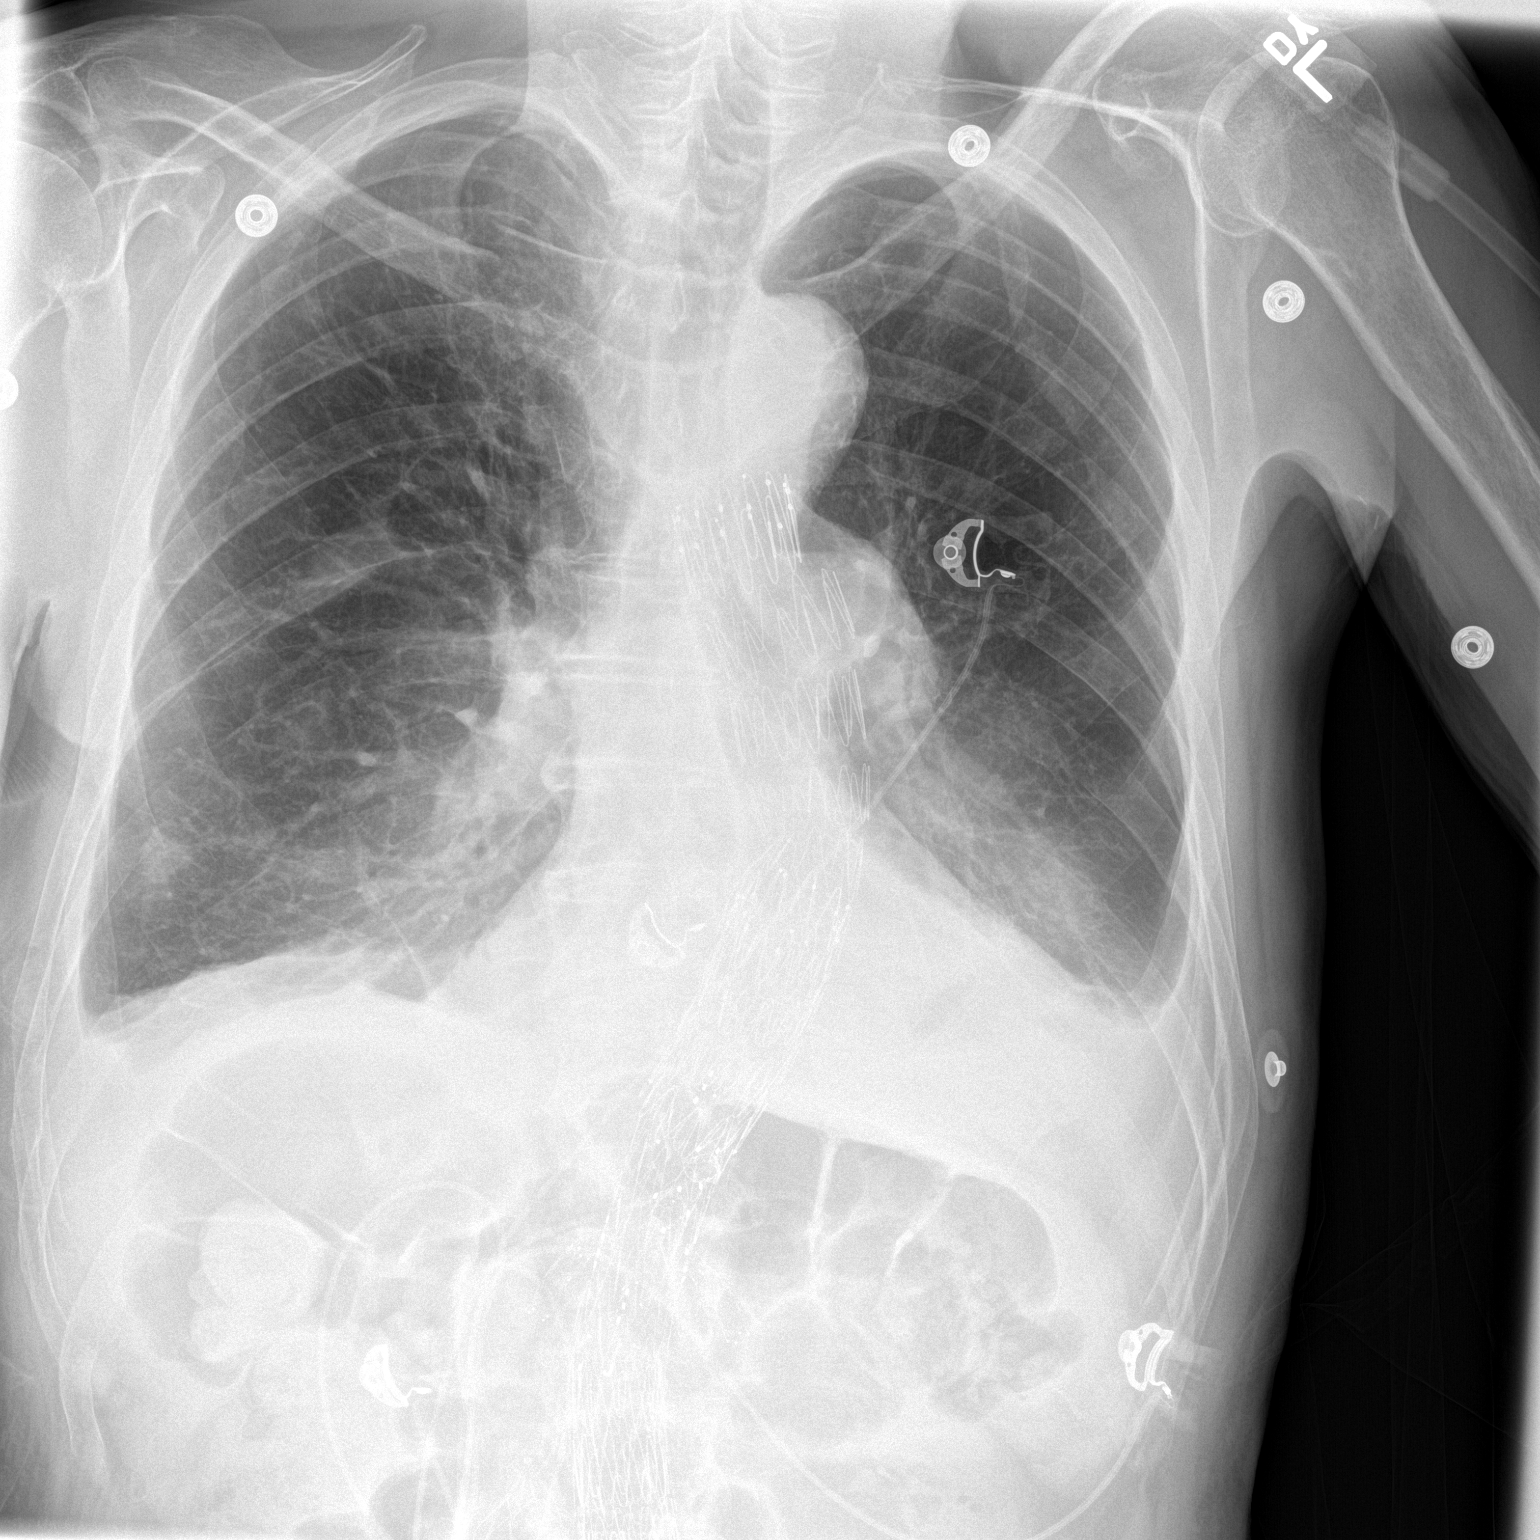
[im 2/2]
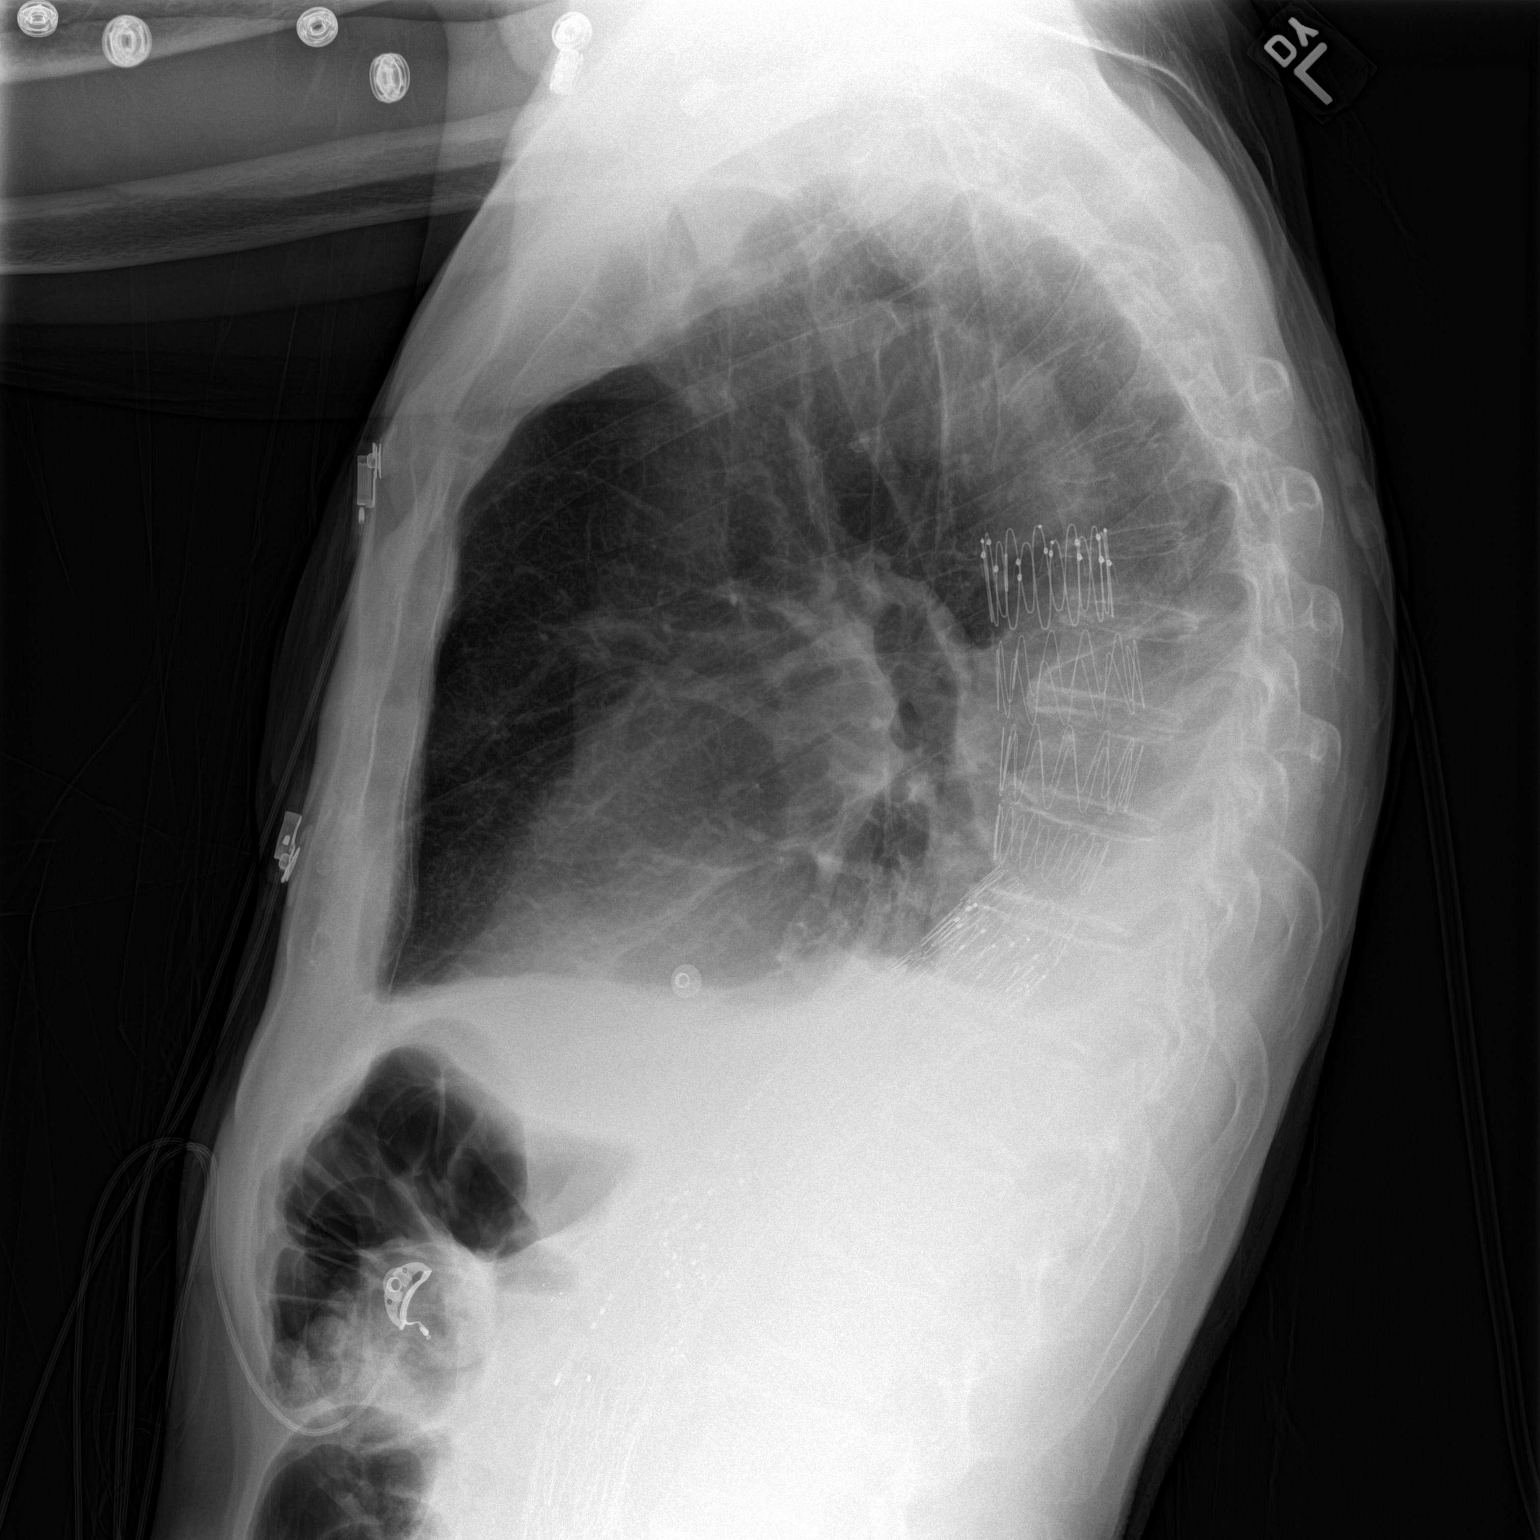

[2 of 2 positions shown; findings below may reference images not displayed]

FINDINGS: RIGHT thoracostomy tube no longer identified.

No pneumothorax following RIGHT chest tube removal.

Persistent bibasilar atelectasis and small pleural effusions.

Nodular density versus atelectasis at RIGHT lung base.

Upper lungs clear.

Prior endo stenting of the descending thoracic and abdominal aorta.

Fractures of the posterior RIGHT fourth and fifth ribs noted.
IMPRESSION: No pneumothorax following chest tube removal.

Persistent bibasilar atelectasis and small pleural effusions.

Questionable nodular density approximately 13 mm diameter versus
atelectasis at RIGHT lung base, without definite lung nodule seen on
recent chest CT at this position; recommend attention on follow-up
exams.

## 2015-03-12 IMAGING — CR DG CHEST 2V
1 series · 2 of 2 positions shown · non-contrast
Comparison: 04/13/2014

CLINICAL DATA: Known rib fractures, pleural effusion

EXAM:
CHEST  2 VIEW

[Series 1: x chest ap · 0.14mm/px · 2 of 2 slices shown]
[im 1/2]
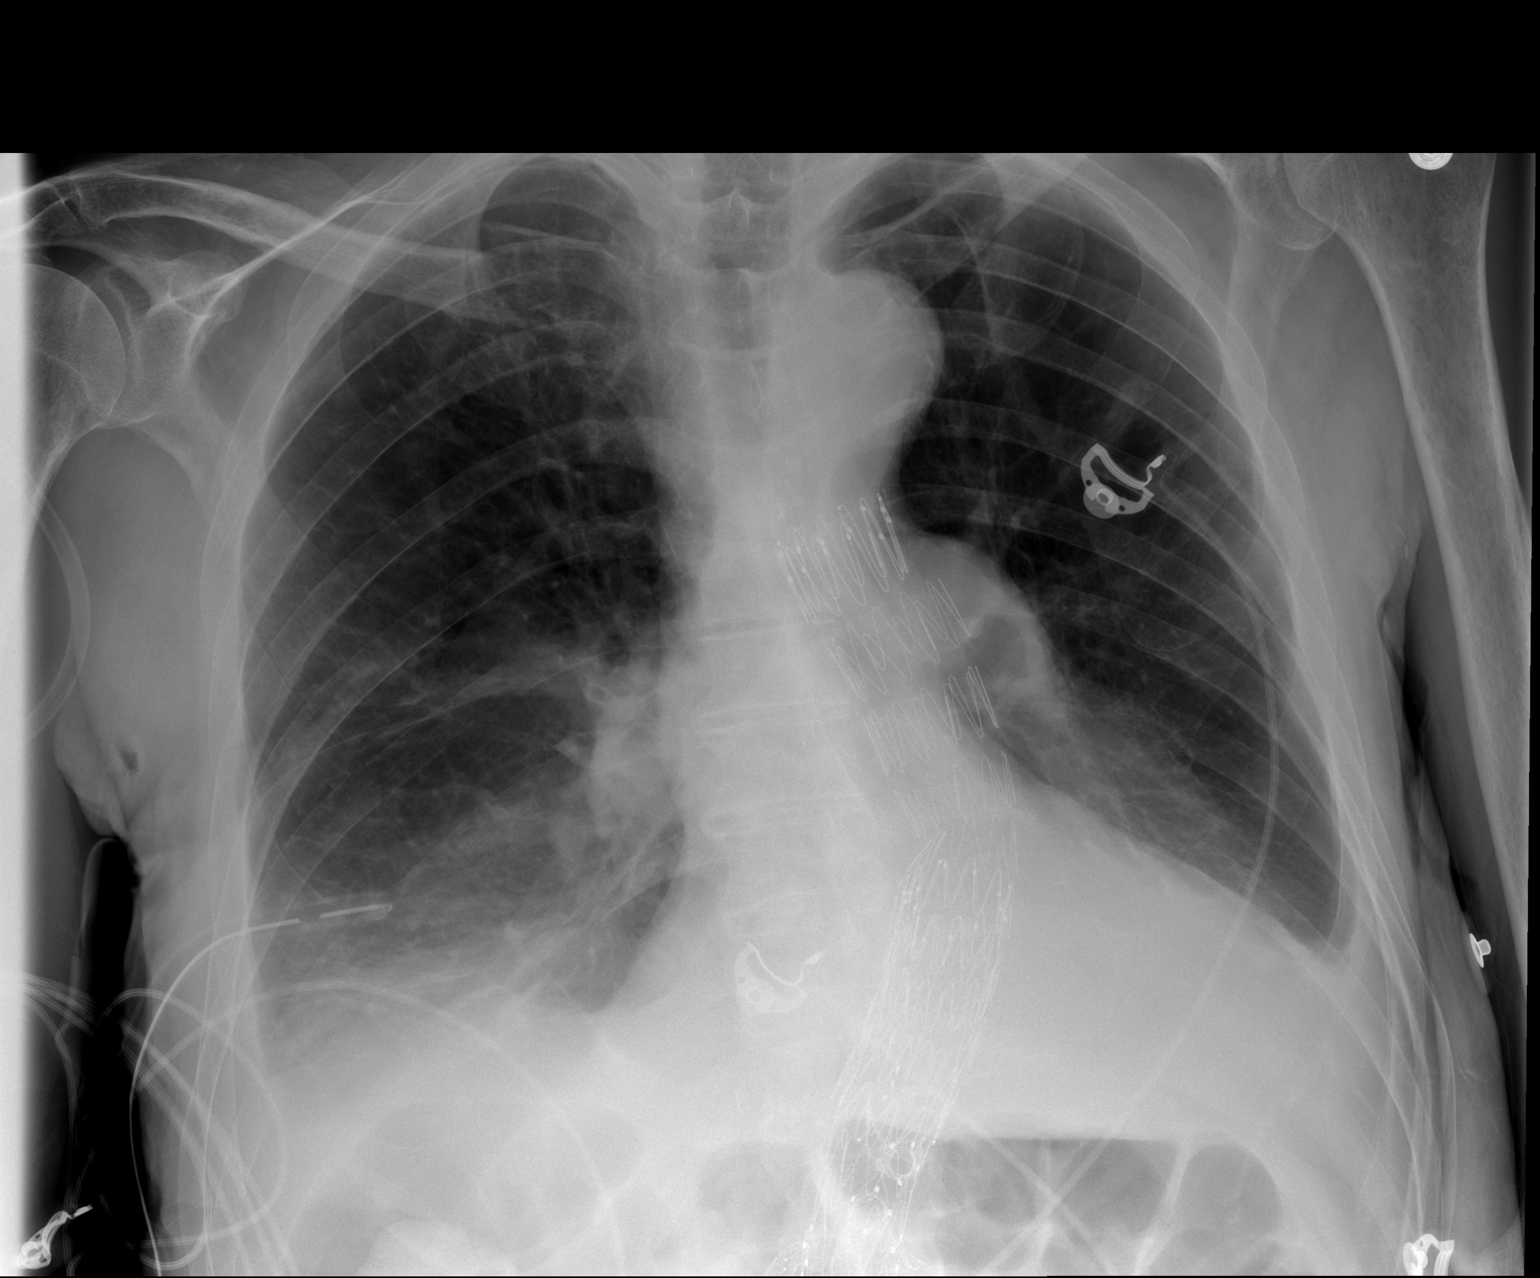
[im 2/2]
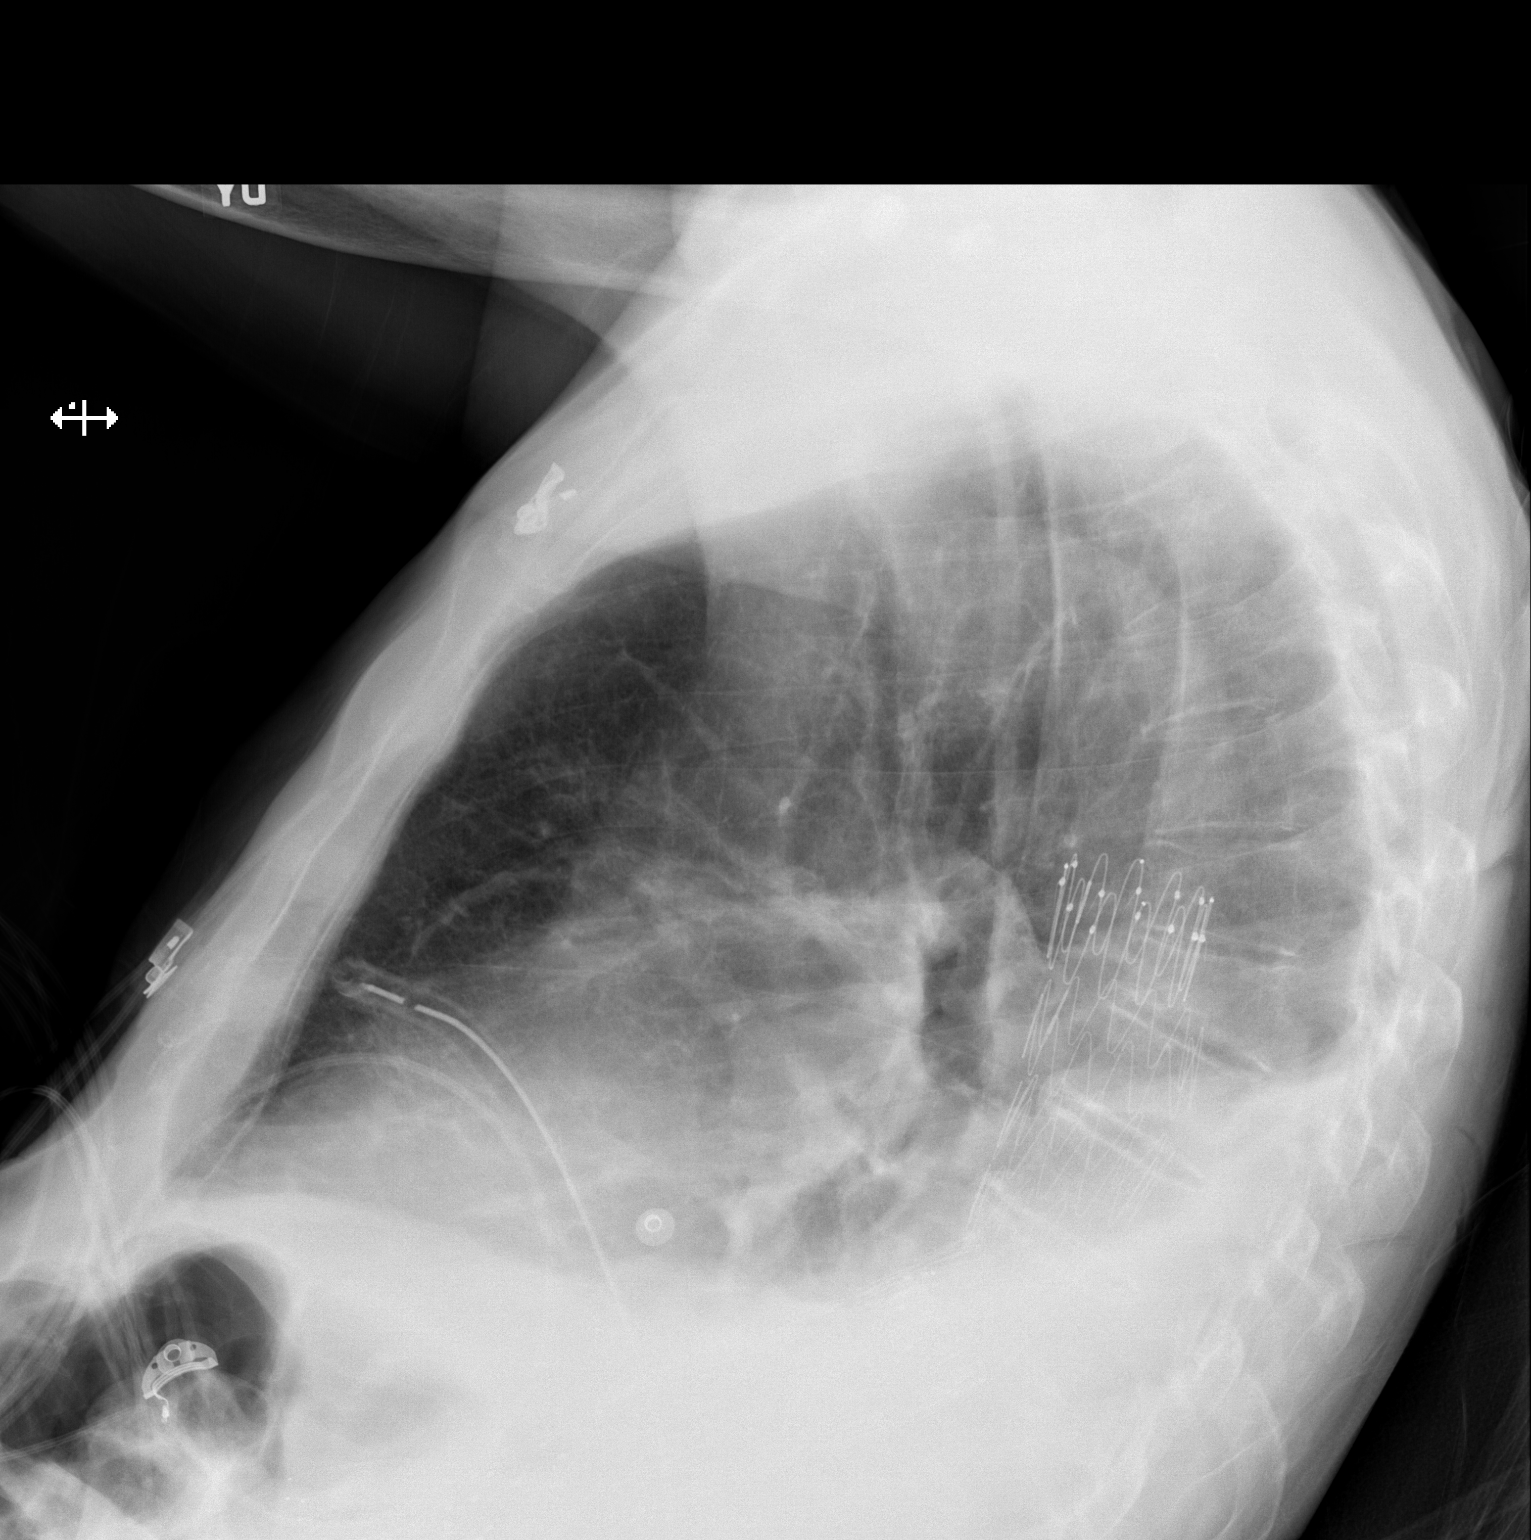

[2 of 2 positions shown; findings below may reference images not displayed]

FINDINGS: Aortic stent graft again noted. Cardiomediastinal silhouette is
stable. Bilateral small pleural effusion with bilateral basilar
atelectasis or infiltrate. Stable right chest tube position. No
pulmonary edema. Thoracic spine osteopenia. Stable compression
deformity mid thoracic spine.
IMPRESSION: No pulmonary edema. Bilateral small pleural effusion with bilateral
basilar atelectasis or infiltrate. Stable right chest tube position.

## 2015-03-15 ENCOUNTER — Other Ambulatory Visit: Payer: Self-pay | Admitting: Family Medicine

## 2015-03-19 DIAGNOSIS — I159 Secondary hypertension, unspecified: Secondary | ICD-10-CM | POA: Diagnosis not present

## 2015-03-19 DIAGNOSIS — M810 Age-related osteoporosis without current pathological fracture: Secondary | ICD-10-CM | POA: Diagnosis not present

## 2015-03-19 DIAGNOSIS — Z5181 Encounter for therapeutic drug level monitoring: Secondary | ICD-10-CM | POA: Diagnosis not present

## 2015-03-19 DIAGNOSIS — I714 Abdominal aortic aneurysm, without rupture: Secondary | ICD-10-CM | POA: Diagnosis not present

## 2015-03-19 DIAGNOSIS — Z86711 Personal history of pulmonary embolism: Secondary | ICD-10-CM | POA: Diagnosis not present

## 2015-03-19 DIAGNOSIS — Z885 Allergy status to narcotic agent status: Secondary | ICD-10-CM | POA: Diagnosis not present

## 2015-03-19 DIAGNOSIS — I4891 Unspecified atrial fibrillation: Secondary | ICD-10-CM | POA: Diagnosis not present

## 2015-03-19 DIAGNOSIS — M069 Rheumatoid arthritis, unspecified: Secondary | ICD-10-CM | POA: Diagnosis not present

## 2015-03-19 DIAGNOSIS — Z7901 Long term (current) use of anticoagulants: Secondary | ICD-10-CM | POA: Diagnosis not present

## 2015-03-19 DIAGNOSIS — J449 Chronic obstructive pulmonary disease, unspecified: Secondary | ICD-10-CM | POA: Diagnosis not present

## 2015-03-19 DIAGNOSIS — Z79899 Other long term (current) drug therapy: Secondary | ICD-10-CM | POA: Diagnosis not present

## 2015-03-19 DIAGNOSIS — I10 Essential (primary) hypertension: Secondary | ICD-10-CM | POA: Diagnosis not present

## 2015-03-27 ENCOUNTER — Other Ambulatory Visit: Payer: Self-pay | Admitting: Family Medicine

## 2015-03-27 NOTE — Telephone Encounter (Signed)
Please advise patient we sent 90 day ref[facility], but he is due for follow up office visit and labs and needs to have this scheduled before any additional refills are due. Thanks.

## 2015-04-06 ENCOUNTER — Other Ambulatory Visit: Payer: Self-pay | Admitting: Family Medicine

## 2015-04-12 IMAGING — CR DG CHEST 2V
1 series · 2 of 2 positions shown · non-contrast
Comparison: 04/21/2014

CLINICAL DATA: Pleural effusion after fall down stairs 3 weeks ago.

EXAM:
CHEST  2 VIEW

[Series 1: kdxr chest pa (or ap) and lat · 0.14mm/px · 2 of 2 slices shown]
[im 1/2]
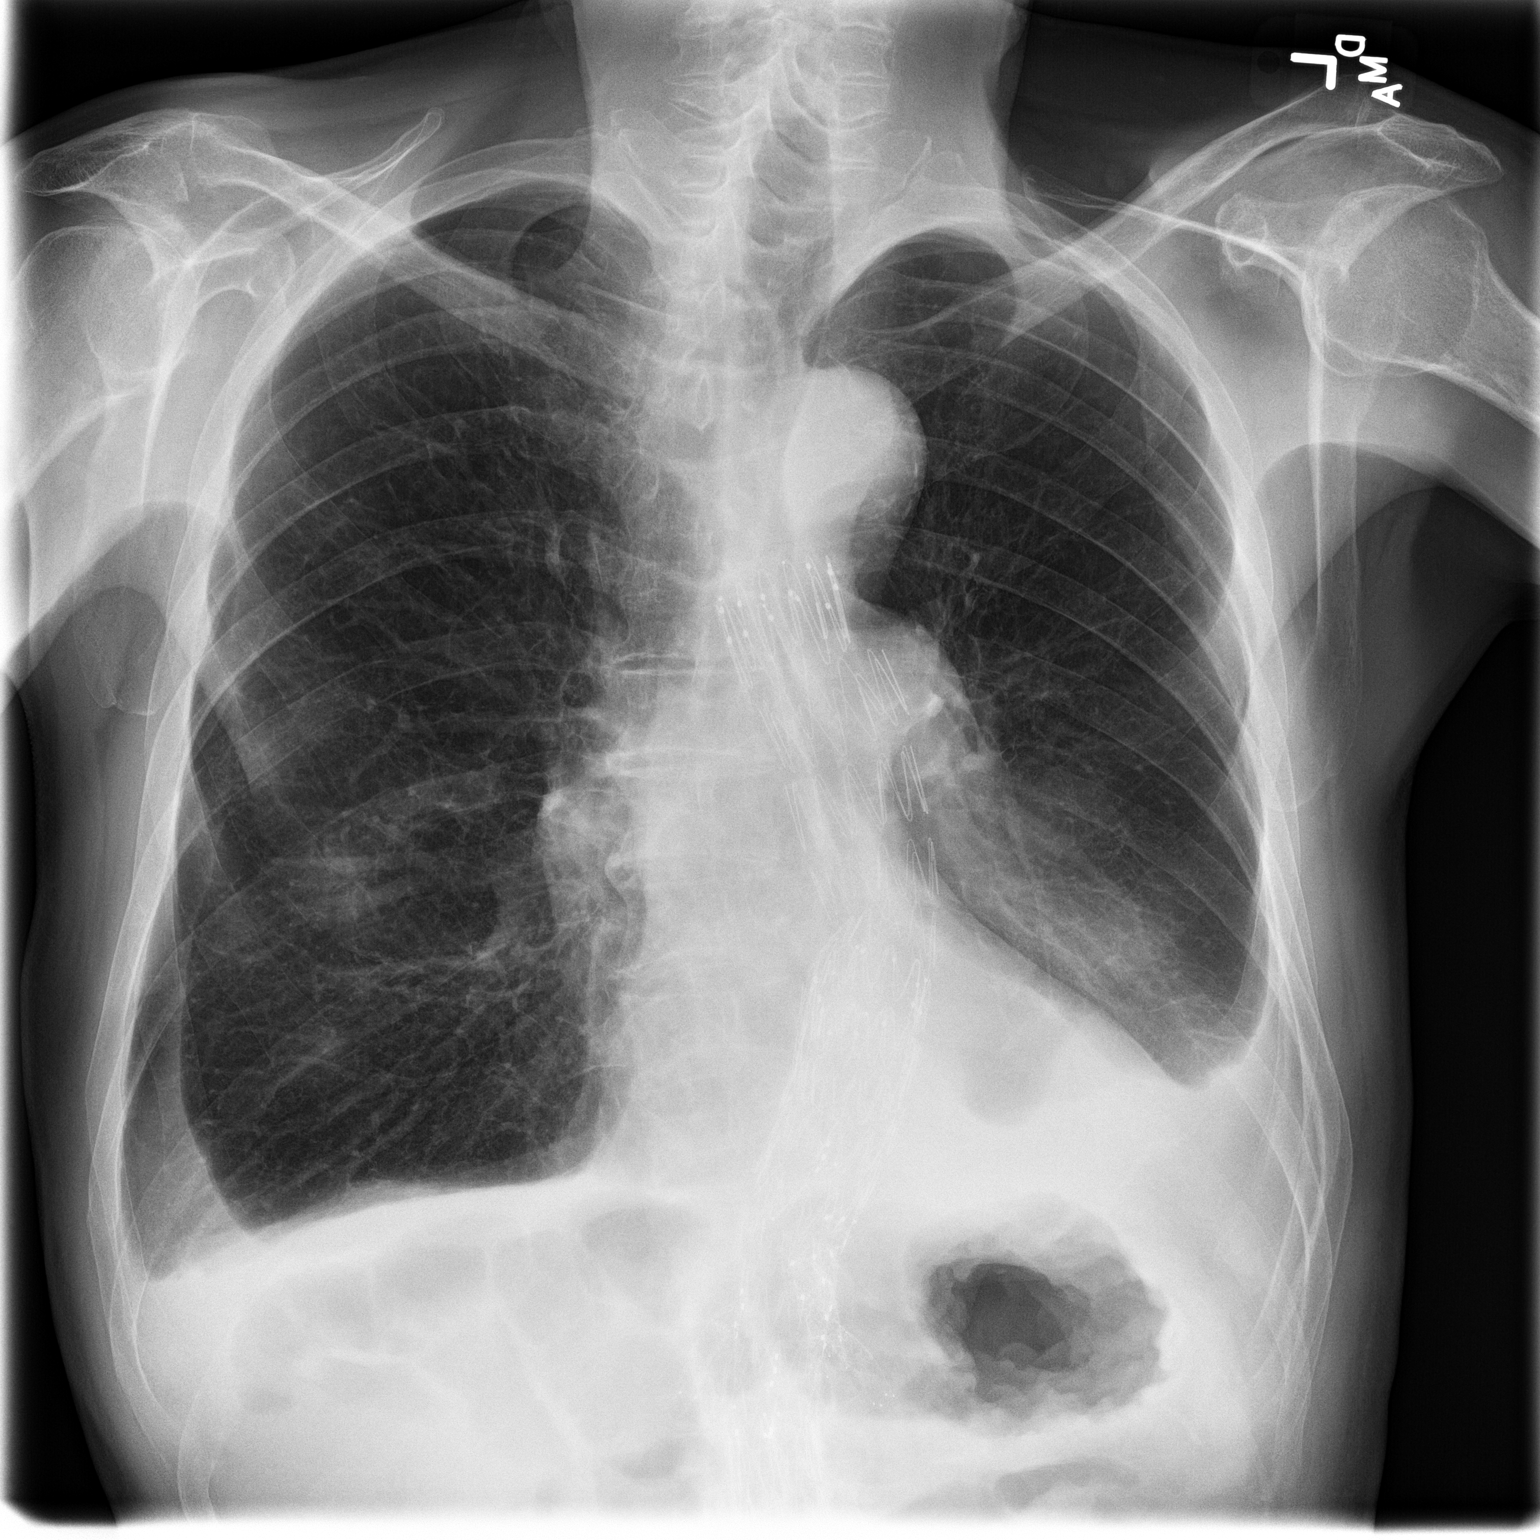
[im 2/2]
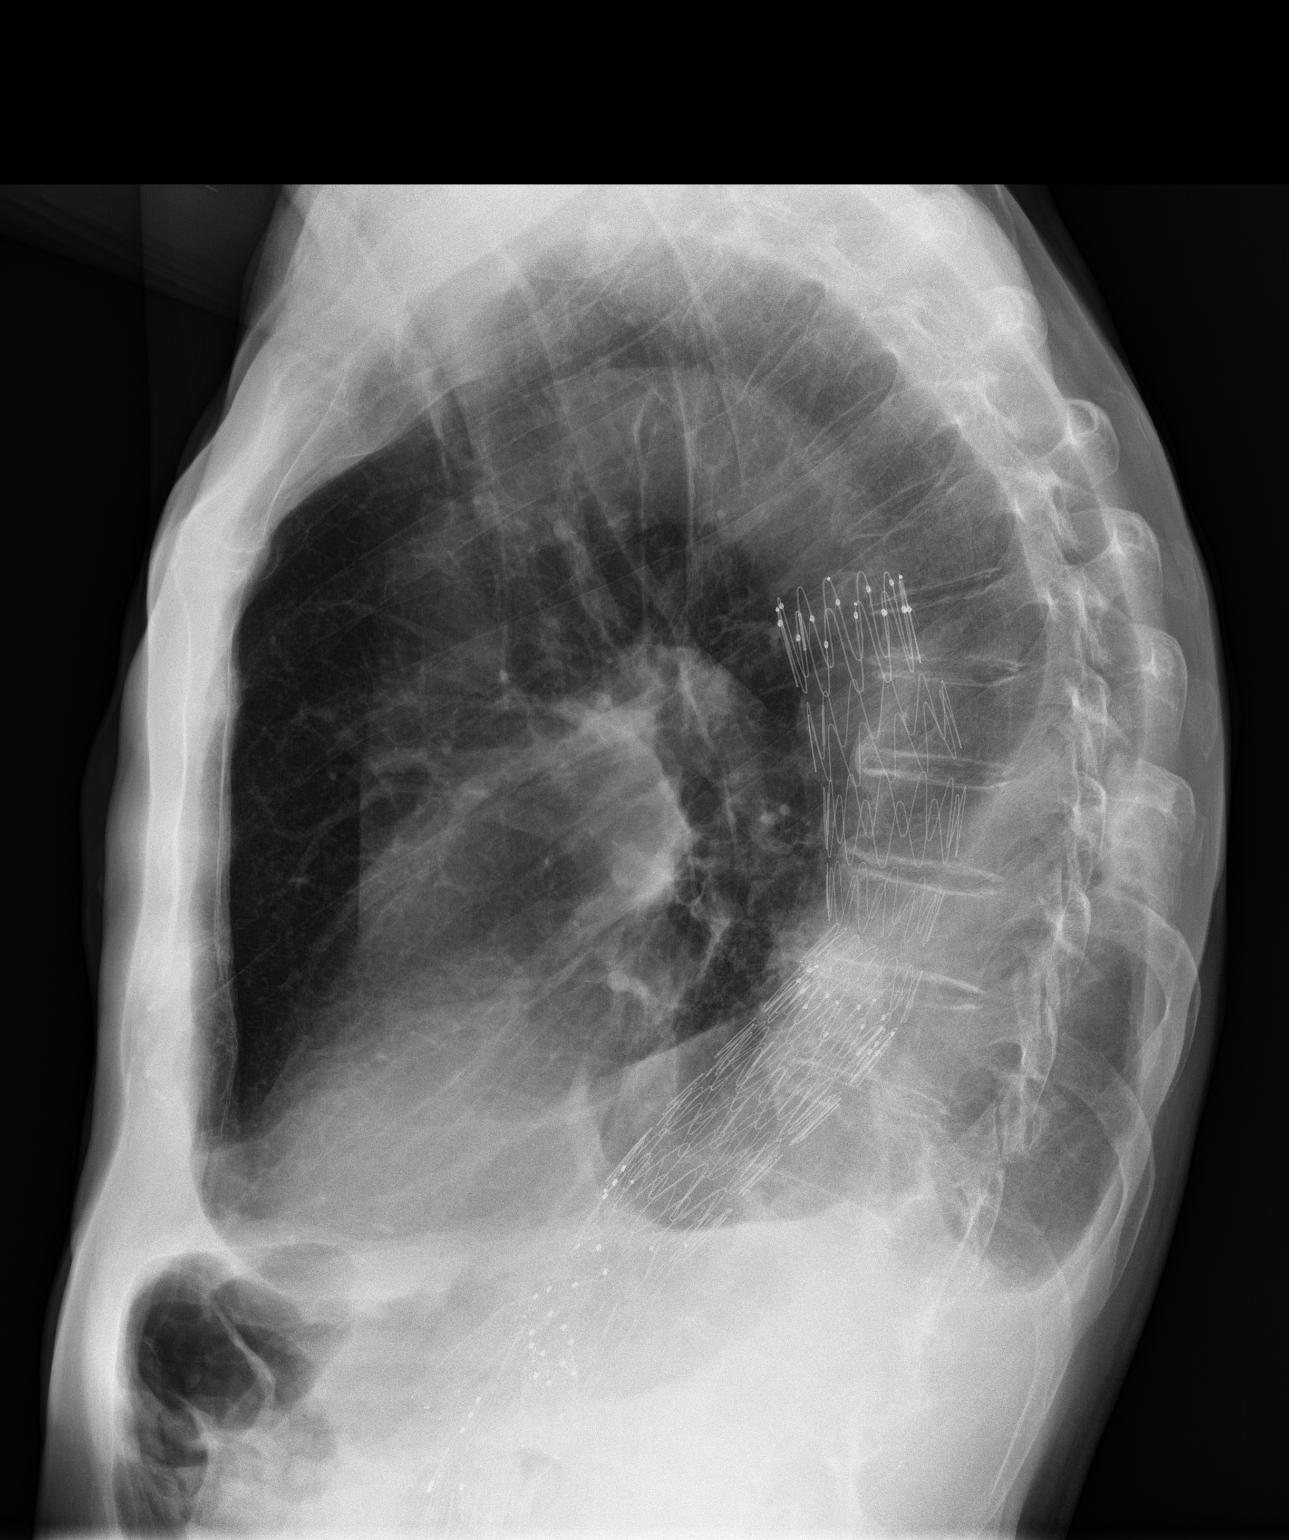

[2 of 2 positions shown; findings below may reference images not displayed]

FINDINGS: Hyperinflation/COPD. Mild accentuation of expected thoracic
kyphosis. Mild osteopenia with loss of upper to mid vertebral body
height at 2 levels. Mild.

Patient rotated minimally left. Normal heart size. Aortic
atherosclerosis with prior descending aortic stent graft repair.
Small left greater than right pleural effusions are not
significantly changed. No pneumothorax. Mild bibasilar volume loss
which is not significantly changed and is worse on the left.
IMPRESSION: No significant change since the prior exam.

Small left greater than right pleural effusions with
hyperinflation/COPD.

## 2015-05-02 DIAGNOSIS — M0579 Rheumatoid arthritis with rheumatoid factor of multiple sites without organ or systems involvement: Secondary | ICD-10-CM | POA: Diagnosis not present

## 2015-05-02 DIAGNOSIS — J449 Chronic obstructive pulmonary disease, unspecified: Secondary | ICD-10-CM | POA: Diagnosis not present

## 2015-05-02 DIAGNOSIS — M810 Age-related osteoporosis without current pathological fracture: Secondary | ICD-10-CM | POA: Diagnosis not present

## 2015-05-09 DIAGNOSIS — M810 Age-related osteoporosis without current pathological fracture: Secondary | ICD-10-CM | POA: Diagnosis not present

## 2015-05-09 DIAGNOSIS — J449 Chronic obstructive pulmonary disease, unspecified: Secondary | ICD-10-CM | POA: Diagnosis not present

## 2015-05-09 DIAGNOSIS — M0579 Rheumatoid arthritis with rheumatoid factor of multiple sites without organ or systems involvement: Secondary | ICD-10-CM | POA: Diagnosis not present

## 2015-06-23 ENCOUNTER — Other Ambulatory Visit: Payer: Self-pay | Admitting: Family Medicine

## 2015-08-02 DIAGNOSIS — I700 Atherosclerosis of aorta: Secondary | ICD-10-CM | POA: Diagnosis not present

## 2015-08-02 DIAGNOSIS — R911 Solitary pulmonary nodule: Secondary | ICD-10-CM | POA: Diagnosis not present

## 2015-08-02 DIAGNOSIS — J90 Pleural effusion, not elsewhere classified: Secondary | ICD-10-CM | POA: Diagnosis not present

## 2015-08-02 DIAGNOSIS — Z006 Encounter for examination for normal comparison and control in clinical research program: Secondary | ICD-10-CM | POA: Diagnosis not present

## 2015-08-02 DIAGNOSIS — I10 Essential (primary) hypertension: Secondary | ICD-10-CM | POA: Diagnosis not present

## 2015-08-02 DIAGNOSIS — I714 Abdominal aortic aneurysm, without rupture: Secondary | ICD-10-CM | POA: Diagnosis not present

## 2015-08-02 DIAGNOSIS — E785 Hyperlipidemia, unspecified: Secondary | ICD-10-CM | POA: Diagnosis not present

## 2015-08-02 DIAGNOSIS — I723 Aneurysm of iliac artery: Secondary | ICD-10-CM | POA: Diagnosis not present

## 2015-08-02 DIAGNOSIS — I2510 Atherosclerotic heart disease of native coronary artery without angina pectoris: Secondary | ICD-10-CM | POA: Diagnosis not present

## 2015-08-02 DIAGNOSIS — Z9689 Presence of other specified functional implants: Secondary | ICD-10-CM | POA: Diagnosis not present

## 2015-08-02 DIAGNOSIS — Z87891 Personal history of nicotine dependence: Secondary | ICD-10-CM | POA: Diagnosis not present

## 2015-08-02 DIAGNOSIS — Z95828 Presence of other vascular implants and grafts: Secondary | ICD-10-CM | POA: Diagnosis not present

## 2015-08-02 DIAGNOSIS — I716 Thoracoabdominal aortic aneurysm, without rupture: Secondary | ICD-10-CM | POA: Diagnosis not present

## 2015-08-02 DIAGNOSIS — R918 Other nonspecific abnormal finding of lung field: Secondary | ICD-10-CM | POA: Diagnosis not present

## 2015-08-02 DIAGNOSIS — Z09 Encounter for follow-up examination after completed treatment for conditions other than malignant neoplasm: Secondary | ICD-10-CM | POA: Diagnosis not present

## 2015-08-02 DIAGNOSIS — J9811 Atelectasis: Secondary | ICD-10-CM | POA: Diagnosis not present

## 2015-08-09 DIAGNOSIS — M0579 Rheumatoid arthritis with rheumatoid factor of multiple sites without organ or systems involvement: Secondary | ICD-10-CM | POA: Diagnosis not present

## 2015-08-09 DIAGNOSIS — J449 Chronic obstructive pulmonary disease, unspecified: Secondary | ICD-10-CM | POA: Diagnosis not present

## 2015-08-09 DIAGNOSIS — M810 Age-related osteoporosis without current pathological fracture: Secondary | ICD-10-CM | POA: Diagnosis not present

## 2015-10-30 DIAGNOSIS — M0579 Rheumatoid arthritis with rheumatoid factor of multiple sites without organ or systems involvement: Secondary | ICD-10-CM | POA: Diagnosis not present

## 2015-10-30 DIAGNOSIS — M810 Age-related osteoporosis without current pathological fracture: Secondary | ICD-10-CM | POA: Diagnosis not present

## 2015-10-30 DIAGNOSIS — J449 Chronic obstructive pulmonary disease, unspecified: Secondary | ICD-10-CM | POA: Diagnosis not present

## 2015-11-07 DIAGNOSIS — M810 Age-related osteoporosis without current pathological fracture: Secondary | ICD-10-CM | POA: Diagnosis not present

## 2015-11-07 DIAGNOSIS — M79641 Pain in right hand: Secondary | ICD-10-CM | POA: Diagnosis not present

## 2015-11-07 DIAGNOSIS — Z79899 Other long term (current) drug therapy: Secondary | ICD-10-CM | POA: Diagnosis not present

## 2015-11-07 DIAGNOSIS — M0579 Rheumatoid arthritis with rheumatoid factor of multiple sites without organ or systems involvement: Secondary | ICD-10-CM | POA: Diagnosis not present

## 2015-12-03 DIAGNOSIS — G43909 Migraine, unspecified, not intractable, without status migrainosus: Secondary | ICD-10-CM | POA: Insufficient documentation

## 2015-12-03 DIAGNOSIS — K5730 Diverticulosis of large intestine without perforation or abscess without bleeding: Secondary | ICD-10-CM | POA: Insufficient documentation

## 2015-12-03 DIAGNOSIS — D509 Iron deficiency anemia, unspecified: Secondary | ICD-10-CM | POA: Insufficient documentation

## 2015-12-03 DIAGNOSIS — K219 Gastro-esophageal reflux disease without esophagitis: Secondary | ICD-10-CM | POA: Insufficient documentation

## 2015-12-03 DIAGNOSIS — J309 Allergic rhinitis, unspecified: Secondary | ICD-10-CM | POA: Insufficient documentation

## 2015-12-03 DIAGNOSIS — E538 Deficiency of other specified B group vitamins: Secondary | ICD-10-CM | POA: Insufficient documentation

## 2015-12-03 DIAGNOSIS — H269 Unspecified cataract: Secondary | ICD-10-CM | POA: Insufficient documentation

## 2015-12-03 DIAGNOSIS — I252 Old myocardial infarction: Secondary | ICD-10-CM | POA: Insufficient documentation

## 2015-12-11 ENCOUNTER — Other Ambulatory Visit: Payer: Self-pay | Admitting: Family Medicine

## 2015-12-24 ENCOUNTER — Encounter: Payer: Self-pay | Admitting: Radiation Oncology

## 2015-12-24 ENCOUNTER — Ambulatory Visit
Admission: RE | Admit: 2015-12-24 | Discharge: 2015-12-24 | Disposition: A | Discharge status: Home / Self Care | Payer: Medicare Other | Source: Ambulatory Visit | Attending: Radiation Oncology | Admitting: Radiation Oncology

## 2015-12-24 ENCOUNTER — Other Ambulatory Visit: Payer: Self-pay | Admitting: Family Medicine

## 2015-12-24 ENCOUNTER — Inpatient Hospital Stay: Payer: Medicare Other | Attending: Radiation Oncology

## 2015-12-24 VITALS — BP 136/65 | HR 67 | Temp 96.8°F | Wt 123.7 lb

## 2015-12-24 DIAGNOSIS — Z86718 Personal history of other venous thrombosis and embolism: Secondary | ICD-10-CM | POA: Diagnosis not present

## 2015-12-24 DIAGNOSIS — Z87891 Personal history of nicotine dependence: Secondary | ICD-10-CM | POA: Diagnosis not present

## 2015-12-24 DIAGNOSIS — Z86711 Personal history of pulmonary embolism: Secondary | ICD-10-CM | POA: Insufficient documentation

## 2015-12-24 DIAGNOSIS — Z7901 Long term (current) use of anticoagulants: Secondary | ICD-10-CM | POA: Insufficient documentation

## 2015-12-24 DIAGNOSIS — Z79899 Other long term (current) drug therapy: Secondary | ICD-10-CM | POA: Insufficient documentation

## 2015-12-24 DIAGNOSIS — J449 Chronic obstructive pulmonary disease, unspecified: Secondary | ICD-10-CM | POA: Insufficient documentation

## 2015-12-24 DIAGNOSIS — Z807 Family history of other malignant neoplasms of lymphoid, hematopoietic and related tissues: Secondary | ICD-10-CM | POA: Insufficient documentation

## 2015-12-24 DIAGNOSIS — I10 Essential (primary) hypertension: Secondary | ICD-10-CM | POA: Insufficient documentation

## 2015-12-24 DIAGNOSIS — R197 Diarrhea, unspecified: Secondary | ICD-10-CM | POA: Diagnosis not present

## 2015-12-24 DIAGNOSIS — I714 Abdominal aortic aneurysm, without rupture: Secondary | ICD-10-CM | POA: Diagnosis not present

## 2015-12-24 DIAGNOSIS — Z7982 Long term (current) use of aspirin: Secondary | ICD-10-CM | POA: Diagnosis not present

## 2015-12-24 DIAGNOSIS — C61 Malignant neoplasm of prostate: Secondary | ICD-10-CM | POA: Diagnosis not present

## 2015-12-24 LAB — PSA: PSA: 0.17 ng/mL (ref 0.00–4.00)

## 2015-12-24 NOTE — Progress Notes (Signed)
Radiation Oncology Follow up Note  Name: Juan Fernandez   Date:   12/24/2015 MRN:  [id number] DOB: June 20, 1945    This 70 y.o. male presents to the clinic today for prostate cancer, stage IIA who presented with a Gleason 7 (3+4) and initial PSA of 5.7.  REFERRING PROVIDER: Birdie Sons, MD  CHIEF COMPLAINT:  Chief Complaint  Patient presents with  . Prostate Cancer    3 year follow up    DIAGNOSIS: The encounter diagnosis was Malignant neoplasm of prostate ([place]).   HPI: Since completing his definitive course of radiation therapy directed to the prostate with IMRT Juan Fernandez has done extremely well.  A PSA obtained one year ago in our office revealed a wonderful level of 0.18.  He remains asymptomatic with respect to his prostate cancer.  Specifically, he reports nocturia x 1 while denying dysuria, hematuria, urinary urgency, urinary hesitation, dribbling, incomplete emptying, difficulty initiating his urine stream, rectal bleeding, rectal discomfort, musculoskeletal discomfort and weight loss.  He does report rare bouts of diarrhea after eating deep fried foods.    PAST MEDICAL HISTORY:  has a past medical history of Prostate cancer ([place]); Elevated PSA; DVT (deep venous thrombosis) ([facility]); PE (pulmonary embolism); COPD (chronic obstructive pulmonary disease) ([place]); AAA (abdominal aortic aneurysm) ([employer]); and HTN (hypertension).    PAST SURGICAL HISTORY:  Past Surgical History  Procedure Laterality Date  . Total hip arthroplasty Right   . Knee surgery Left   . Prostate biopsy  05/19/2012    Gleason 3+3=6 l base, 3+4=7 RM and LA, RMB  . Ivc filter  2010  . Cataract extraction      FAMILY HISTORY: family history includes Lymphoma in his mother.  SOCIAL HISTORY:  reports that he quit smoking about 12 years ago. He started smoking about 53 years ago. He does not have any smokeless tobacco history on file. He reports that he does not drink alcohol or use illicit drugs.  ALLERGIES:  Flunisolide and Morphine  MEDICATIONS:  Current Outpatient Prescriptions  Medication Sig Dispense Refill  . alendronate (FOSAMAX) 10 MG tablet Take 70 mg by mouth.    Marland Kitchen apixaban (ELIQUIS) 5 MG TABS tablet TAKE 1 TABLET TWICE A DAY    . aspirin 325 MG tablet Take 325 mg by mouth daily.    . calcium carbonate (OS-CAL) 600 MG TABS Take 600 mg by mouth 2 (two) times daily with a meal.    . Cholecalciferol (VITAMIN D-1000 MAX ST) 1000 UNITS tablet Take by mouth.    . dofetilide (TIKOSYN) 500 MCG capsule TAKE 1 CAPSULE EVERY 12 HOURS    . enalapril (VASOTEC) 5 MG tablet Take 5 mg by mouth 2 (two) times daily.    . ferrous fumarate (HEMOCYTE - 106 MG FE) 325 (106 FE) MG TABS Take 1 tablet by mouth.    . flunisolide (NASAREL) 29 MCG/ACT (0.025%) nasal spray Place into the nose.    . folic acid (FOLVITE) 1 MG tablet Take 1 mg by mouth daily.    . hydroxychloroquine (PLAQUENIL) 200 MG tablet Take by mouth daily.    . methotrexate (RHEUMATREX) 2.5 MG tablet Take 2.5 mg by mouth once a week. Caution:Chemotherapy. Protect from light.    . metoprolol (LOPRESSOR) 50 MG tablet TAKE ONE-HALF (1/2) TABLET TWICE A DAY 90 tablet 0  . metoprolol succinate (TOPROL-XL) 50 MG 24 hr tablet Take 50 mg by mouth daily. Take with or immediately following a meal.    . mometasone (NASONEX) 50  MCG/ACT nasal spray USE 2 SPRAYS IN EACH NOSTRIL DAILY AS NEEDED 51 g 0  . NASCOBAL 500 MCG/0.1ML SOLN USE 1 SPRAY IN 1 NOSTRIL EVERY WEEK 1.3 mL 4  . NEXIUM 40 MG capsule TAKE 1 CAPSULE TWICE A DAY 180 capsule 3  . simvastatin (ZOCOR) 20 MG tablet for cholesterol. PATIENT NEEDS TO SCHEDULE OFFICE VISIT FOR FOLLOW UP 90 tablet 0  . SPIRIVA HANDIHALER 18 MCG inhalation capsule INHALE THE CONTENTS OF 1 CAPSULE THROUGH INHALATION DEVICE DAILY 90 capsule 4  . warfarin (COUMADIN) 5 MG tablet Take 5 mg by mouth daily. Takes 1/2 tablet Tuesday, Thursday, Sat and Sunday.  Takes full tablet Monday, Wednesday and Friday     No current  facility-administered medications for this encounter.    REVIEW OF SYSTEMS:  Review of Systems  Constitutional: Negative for fever, weight loss and malaise/fatigue.  HENT: Negative for congestion and sore throat.   Eyes: Negative for double vision and redness.  Respiratory: Negative for cough, hemoptysis, sputum production, shortness of breath, wheezing and stridor.   Cardiovascular: Negative for chest pain, palpitations and leg swelling.  Gastrointestinal: Positive for diarrhea. Negative for nausea, vomiting and constipation.  Genitourinary: Negative for dysuria, urgency and frequency.  Musculoskeletal: Negative for myalgias, back pain and joint pain.  Skin: Negative for itching and rash.  Neurological: Negative for dizziness, speech change, focal weakness, seizures, weakness and headaches.  Endo/Heme/Allergies: Does not bruise/bleed easily.  Psychiatric/Behavioral: Negative for depression and memory loss.  Positive for occasional bouts of diarrhea after eating deep fried foods.   PHYSICAL EXAM: BP 136/65 mmHg  Pulse 67  Temp(Src) 96.8 F (36 C)  Wt 123 lb 10.9 oz (56.1 kg)  Physical Exam  Constitutional: He is oriented to person, place, and time and well-developed, well-nourished, and in no distress.  HENT:  Head: Normocephalic and atraumatic.  Mouth/Throat: Oropharynx is clear and moist.  Eyes: EOM are normal. Pupils are equal, round, and reactive to light.  Neck: Neck supple. No JVD present. No tracheal deviation present. No thyromegaly present.  Cardiovascular: Normal rate and regular rhythm.   Pulmonary/Chest: Breath sounds normal. No respiratory distress. He has no wheezes. He has no rales.  Abdominal: He exhibits no distension and no mass. There is no tenderness. There is no guarding.  Genitourinary: Rectum normal and prostate normal.  Musculoskeletal: He exhibits no tenderness.  Lymphadenopathy:    He has no cervical adenopathy.  Neurological: He is alert and oriented  to person, place, and time. No cranial nerve deficit. Gait normal.  Skin: Skin is warm and dry. No rash noted. No erythema.  Psychiatric: Mood and memory normal.    RADIOLOGY RESULTS:  No results found.   PLAN: PSA was drawn today prior to my physical examination.  The patient will be notified of the results tomorrow.  I have asked him to return for routine follow-up in our clinic in one year or sooner should problems arise.  As always, thank you very much for allowing me to participate in the care of this extremely pleasant gentleman.    Consuello Bossier, MD

## 2015-12-25 ENCOUNTER — Other Ambulatory Visit: Payer: Self-pay | Admitting: Family Medicine

## 2015-12-25 ENCOUNTER — Telehealth: Payer: Self-pay | Admitting: Family Medicine

## 2015-12-25 MED ORDER — SIMVASTATIN 20 MG PO TABS: 20.0000 mg | ORAL_TABLET | Freq: Every day | ORAL | Status: DC

## 2015-12-25 NOTE — Telephone Encounter (Signed)
Please advise patient he is overdue for follow up o.v. Have sent one prescription for his atorvastatin to get by until he can be seen in office. Please schedule visit within the next month.

## 2015-12-27 NOTE — Telephone Encounter (Signed)
Advised patient as below. Patient reports that he will schedule an OV within the next month. Advised that there will be no further refills until then.

## 2016-01-27 ENCOUNTER — Other Ambulatory Visit: Payer: Self-pay | Admitting: Family Medicine

## 2016-02-07 DIAGNOSIS — Z79899 Other long term (current) drug therapy: Secondary | ICD-10-CM | POA: Diagnosis not present

## 2016-02-07 DIAGNOSIS — M0579 Rheumatoid arthritis with rheumatoid factor of multiple sites without organ or systems involvement: Secondary | ICD-10-CM | POA: Diagnosis not present

## 2016-03-10 ENCOUNTER — Other Ambulatory Visit: Payer: Self-pay | Admitting: Family Medicine

## 2016-03-13 DIAGNOSIS — I719 Aortic aneurysm of unspecified site, without rupture: Secondary | ICD-10-CM | POA: Diagnosis not present

## 2016-03-13 DIAGNOSIS — I2510 Atherosclerotic heart disease of native coronary artery without angina pectoris: Secondary | ICD-10-CM | POA: Diagnosis not present

## 2016-03-13 DIAGNOSIS — Z95828 Presence of other vascular implants and grafts: Secondary | ICD-10-CM | POA: Diagnosis not present

## 2016-03-13 DIAGNOSIS — Z006 Encounter for examination for normal comparison and control in clinical research program: Secondary | ICD-10-CM | POA: Diagnosis not present

## 2016-03-13 DIAGNOSIS — J90 Pleural effusion, not elsewhere classified: Secondary | ICD-10-CM | POA: Diagnosis not present

## 2016-03-13 DIAGNOSIS — I714 Abdominal aortic aneurysm, without rupture: Secondary | ICD-10-CM | POA: Diagnosis not present

## 2016-03-13 DIAGNOSIS — I7100 Dissection of unspecified site of aorta: Secondary | ICD-10-CM | POA: Diagnosis not present

## 2016-03-13 DIAGNOSIS — I10 Essential (primary) hypertension: Secondary | ICD-10-CM | POA: Diagnosis not present

## 2016-03-13 DIAGNOSIS — Z5181 Encounter for therapeutic drug level monitoring: Secondary | ICD-10-CM | POA: Diagnosis not present

## 2016-03-13 DIAGNOSIS — Z79899 Other long term (current) drug therapy: Secondary | ICD-10-CM | POA: Diagnosis not present

## 2016-03-13 DIAGNOSIS — E785 Hyperlipidemia, unspecified: Secondary | ICD-10-CM | POA: Diagnosis not present

## 2016-03-13 DIAGNOSIS — Z96641 Presence of right artificial hip joint: Secondary | ICD-10-CM | POA: Diagnosis not present

## 2016-03-13 DIAGNOSIS — Z87891 Personal history of nicotine dependence: Secondary | ICD-10-CM | POA: Diagnosis not present

## 2016-03-13 DIAGNOSIS — I716 Thoracoabdominal aortic aneurysm, without rupture: Secondary | ICD-10-CM | POA: Diagnosis not present

## 2016-03-13 DIAGNOSIS — Z09 Encounter for follow-up examination after completed treatment for conditions other than malignant neoplasm: Secondary | ICD-10-CM | POA: Diagnosis not present

## 2016-03-23 ENCOUNTER — Other Ambulatory Visit: Payer: Self-pay | Admitting: Family Medicine

## 2016-03-24 ENCOUNTER — Other Ambulatory Visit: Payer: Self-pay | Admitting: Family Medicine

## 2016-03-24 NOTE — Telephone Encounter (Signed)
  PATIENT NEEDS TO SCHEDULE OFFICE VISIT FOR FOLLOW UP BEFORE MEDICATIONS CAN BE REFILLE.D

## 2016-03-24 NOTE — Telephone Encounter (Signed)
Please advise patient.  

## 2016-03-26 NOTE — Telephone Encounter (Signed)
Patient advised. Patient states he has a follow up appointment scheduled for next Wednesday, and he has enough medication to last until then.

## 2016-04-02 ENCOUNTER — Encounter: Payer: Self-pay | Admitting: Family Medicine

## 2016-04-02 ENCOUNTER — Ambulatory Visit (INDEPENDENT_AMBULATORY_CARE_PROVIDER_SITE_OTHER): Payer: Medicare Other | Admitting: Family Medicine

## 2016-04-02 VITALS — BP 128/58 | HR 70 | Temp 98.0°F | Resp 16 | Ht 67.0 in | Wt 126.0 lb

## 2016-04-02 DIAGNOSIS — E559 Vitamin D deficiency, unspecified: Secondary | ICD-10-CM | POA: Diagnosis not present

## 2016-04-02 DIAGNOSIS — Z1211 Encounter for screening for malignant neoplasm of colon: Secondary | ICD-10-CM | POA: Diagnosis not present

## 2016-04-02 DIAGNOSIS — E7800 Pure hypercholesterolemia, unspecified: Secondary | ICD-10-CM | POA: Diagnosis not present

## 2016-04-02 DIAGNOSIS — Z0000 Encounter for general adult medical examination without abnormal findings: Secondary | ICD-10-CM

## 2016-04-02 NOTE — Progress Notes (Signed)
Patient: Juan Fernandez, Male    DOB: November 23, 1945, 70 y.o.   MRN: [id number] Visit Date: 04/02/2016  Today's Provider: [name], MD   Chief Complaint  Patient presents with  . Medicare Wellness  . Hypertension  . Hyperlipidemia  . COPD  . Atrial Fibrillation   Subjective:    Annual wellness visit Juan Fernandez is a 70 y.o. male. He feels well. He reports exercising none. He reports he is sleeping well.  -----------------------------------------------------------     Hypertension, follow-up:  BP Readings from Last 3 Encounters:  04/02/16 (!) 128/58  12/24/15 136/65  07/14/14 (!) 120/58    He was last seen for hypertension 03/29/2013.  BP at that visit was 90/60. Management since that visit includes; stopped Flomax due to low blood pressure.He reports good compliance with treatment. He is not having side effects. none He is not exercising. He is adherent to low salt diet.   Outside blood pressures are n/a. He is experiencing none.  Patient denies none.   Cardiovascular risk factors include none.  Use of agents associated with hypertension: none.   ----------------------------------------------------------------    Lipid/Cholesterol, Follow-up:   Last seen for this 12/29/2012.  Management since that visit includes; no changes.  Last Lipid Panel: No results found for: CHOL, TRIG, HDL, CHOLHDL, VLDL, LDLCALC, LDLDIRECT  He reports good compliance with treatment. He is not having side effects. none  Wt Readings from Last 3 Encounters:  04/02/16 126 lb (57.2 kg)  12/24/15 123 lb 10.9 oz (56.1 kg)  07/14/14 137 lb (62.1 kg)    ----------------------------------------------------------------  Recently had labs done by rheumatology including normal met C (cr=0.6) and mild anemia (hgb=11.6)  Had EKG 03/13/16 showing sinus brady, possible LVH. No ischemia. Continues routine follow up with Dr. Silverio Decamp  Had BMD dr. Jefm Bryant 02/07/15   Review of  Systems  Constitutional: Negative.   HENT: Positive for rhinorrhea.   Eyes: Negative.   Respiratory: Positive for shortness of breath.   Cardiovascular: Negative.   Gastrointestinal: Negative.   Endocrine: Negative.   Genitourinary: Negative.   Musculoskeletal: Positive for back pain.  Skin: Negative.   Allergic/Immunologic: Negative.   Neurological: Negative.   Hematological: Negative.   Psychiatric/Behavioral: Negative.     Social History   Social History  . Marital status: Married    Spouse name: N/A  . Number of children: 3  . Years of education: N/A   Occupational History  . Not on file.   Social History Main Topics  . Smoking status: Former Smoker    Packs/day: 1.00    Start date: 06/16/1962    Quit date: 10/07/2003  . Smokeless tobacco: Not on file  . Alcohol use No  . Drug use: No  . Sexual activity: Not on file   Other Topics Concern  . Not on file   Social History Narrative  . No narrative on file    Past Medical History:  Diagnosis Date  . AAA (abdominal aortic aneurysm) ([place])   . COPD (chronic obstructive pulmonary disease) ([place])   . DVT (deep venous thrombosis) ([place])   . Elevated PSA   . HTN (hypertension)   . PE (pulmonary embolism)   . Prostate cancer [facility])      Patient Active Problem List   Diagnosis Date Noted  . Iron deficiency anemia 12/03/2015  . Allergic rhinitis 12/03/2015  . B12 deficiency 12/03/2015  . Bilateral cataracts 12/03/2015  . Diverticulosis of colon 12/03/2015  . Acid  reflux 12/03/2015  . Old myocardial infarction 12/03/2015  . Migraine 12/03/2015  . Asthma without status asthmaticus 03/20/2014  . CAFL (chronic airflow limitation) ([place]) 03/20/2014  . OP (osteoporosis) 02/14/2014  . Atrial fibrillation by electrocardiogram ([place]) 09/30/2013  . Prostate cancer ([place]) 10/06/2012  . HLD (hyperlipidemia) 12/21/2009  . Avitaminosis D 05/04/2009  . Rheumatoid arthritis ([place]) 03/10/2009    Past Surgical History:    Procedure Laterality Date  . AORTA SURGERY  2016   [facility] Sammuel Hines at [facility]  . CATARACT EXTRACTION    . ivc filter  2010  . KNEE SURGERY Left   . PROSTATE BIOPSY  05/19/2012   Gleason 3+3=6 l base, 3+4=7 RM and LA, RMB  . TOTAL HIP ARTHROPLASTY Right     His family history includes Lymphoma in his mother.    Current Meds  Medication Sig  . alendronate (FOSAMAX) 10 MG tablet Take 70 mg by mouth.  Marland Kitchen apixaban (ELIQUIS) 5 MG TABS tablet TAKE 1 TABLET TWICE A DAY  . aspirin 325 MG tablet Take 325 mg by mouth daily.  . calcium carbonate (OS-CAL) 600 MG TABS Take 600 mg by mouth 2 (two) times daily with a meal.  . Cholecalciferol (VITAMIN D-1000 MAX ST) 1000 UNITS tablet Take by mouth.  . dofetilide (TIKOSYN) 500 MCG capsule TAKE 1 CAPSULE EVERY 12 HOURS  . ferrous fumarate (HEMOCYTE - 106 MG FE) 325 (106 FE) MG TABS Take 1 tablet by mouth.  . folic acid (FOLVITE) 1 MG tablet Take 1 mg by mouth daily.  . hydroxychloroquine (PLAQUENIL) 200 MG tablet Take by mouth daily.  . methotrexate (RHEUMATREX) 2.5 MG tablet Take 2.5 mg by mouth once a week. Caution:Chemotherapy. Protect from light.  . metoprolol (LOPRESSOR) 50 MG tablet TAKE ONE-HALF (1/2) TABLET TWICE A DAY  . NASCOBAL 500 MCG/0.1ML SOLN USE 1 SPRAY IN 1 NOSTRIL EVERY WEEK  . NEXIUM 40 MG capsule TAKE 1 CAPSULE TWICE A DAY (Patient taking differently: TAKE 1 CAPSULE  A DAY)  . simvastatin (ZOCOR) 20 MG tablet Take 1 tablet (20 mg total) by mouth daily. for cholesterol. PATIENT NEEDS TO SCHEDULE OFFICE VISIT FOR FOLLOW UP  . SPIRIVA HANDIHALER 18 MCG inhalation capsule INHALE THE CONTENTS OF 1 CAPSULE THROUGH INHALATION DEVICE DAILY  . warfarin (COUMADIN) 5 MG tablet Take 5 mg by mouth daily. Takes 1/2 tablet Tuesday, Thursday, Sat and Sunday.  Takes full tablet Monday, Wednesday and Friday    Patient Care Team: Birdie Sons, MD as PCP - General (Family Medicine) Thomasene Ripple, MD as Attending Physician (Vascular Surgery) Julieanne Manson  Leeanne Mannan., MD (Rheumatology) Christoper Allegra, NP as Nurse Practitioner (Cardiology) Alexis Frock, MD as Consulting Physician (Urology)    Objective:   Vitals: BP (!) 128/58 (BP Location: Left Arm, Patient Position: Sitting, Cuff Size: Normal)   Pulse 70   Temp 98 F (36.7 C) (Oral)   Resp 16   Ht _0  (1.702 m)   Wt 126 lb (57.2 kg)   SpO2 95%   BMI 19.73 kg/m   Physical Exam   General Appearance:    Alert, cooperative, no distress  Eyes:    PERRL, conjunctiva/corneas clear, EOM's intact       Lungs:     Clear to auscultation bilaterally, respirations unlabored  Heart:    Regular rate and rhythm  Neurologic:   Awake, alert, oriented x 3. No apparent focal neurological           defect.  Activities of Daily Living In your present state of health, do you have any difficulty performing the following activities: 04/02/2016  Hearing? N  Vision? N  Difficulty concentrating or making decisions? N  Walking or climbing stairs? Y  Dressing or bathing? N  Doing errands, shopping? N  Some recent data might be hidden    Fall Risk Assessment Fall Risk  04/02/2016 12/25/2014  Falls in the past year? No No     Depression Screen PHQ 2/9 Scores 04/02/2016 12/25/2014  PHQ - 2 Score 0 0    Cognitive Testing - 6-CIT  Correct? Score   What year is it? yes 0 0 or 4  What month is it? yes 0 0 or 3  Memorize:    Pia Mau,  41,  [place],      What time is it? (within 1 hour) yes 0 0 or 3  Count backwards from 20 yes 0 0, 2, or 4  Name the months of the year yes 2 0, 2, or 4  Repeat name & address above yes 3 0, 2, 4, 6, 8, or 10       TOTAL SCORE  5/28   Interpretation:  Normal  Normal (0-7) Abnormal (8-28)    Audit-C Alcohol Use Screening  Question Answer Points  How often do you have alcoholic drink? never 0  On days you do drink alcohol, how many drinks do you typically consume? 0 0  How oftey will you drink 6 or more in a total? never 0  Total  Score:  0   A score of 3 or more in women, and 4 or more in men indicates increased risk for alcohol abuse, EXCEPT if all of the points are from question 1.     Assessment & Plan:     Annual Wellness Visit  Reviewed patient's Family Medical History Reviewed and updated list of patient's medical providers Assessment of cognitive impairment was done Assessed patient's functional ability Established a written schedule for health screening [place] Completed and Reviewed  Exercise Activities and Dietary recommendations Goals    None      Immunization History  Administered Date(s) Administered  . Pneumococcal Conjugate-13 07/14/2014  . Pneumococcal Polysaccharide-23 08/29/2009    Health Maintenance  Topic Date Due  . Hepatitis C Screening  1945-06-19  . TETANUS/TDAP  09/24/1963  . COLONOSCOPY  09/24/1994  . ZOSTAVAX  09/23/2004  . PNA vac Low Risk Adult (2 of 2 - PPSV23) 07/15/2015  . INFLUENZA VACCINE  01/15/2016      Discussed health benefits of physical activity, and encouraged him to engage in regular exercise appropriate for his age and condition.    --------------------------------------------------------------------------  1. Medicare annual wellness visit, subsequent Generally doing well  He declined flu shot today  2. Pure hypercholesterolemia He is tolerating simvastatin well with no adverse effects.   - Lipid panel  3. Avitaminosis D  - VITAMIN D 25 Hydroxy (Vit-D Deficiency, Fractures)  The entirety of the information documented in the History of Present Illness, Review of Systems and Physical Exam were personally obtained by me. Portions of this information were initially documented by Corinthian Kemler M. Sabra Heck, CMA and reviewed by me for thoroughness and accuracy.     [name], MD  [facility]

## 2016-04-02 NOTE — Patient Instructions (Signed)
I recommend the Zostavax vaccine to reduce your risk of getting shingles. This vaccine may be covered by your Medicare Part D plan (the prescription plan.) Please check with your pharmacist to see if it is covered.

## 2016-04-03 ENCOUNTER — Telehealth: Payer: Self-pay | Admitting: Family Medicine

## 2016-04-03 NOTE — Telephone Encounter (Signed)
Order for cologuard faxed to [facility] °

## 2016-04-08 ENCOUNTER — Other Ambulatory Visit: Payer: Self-pay | Admitting: Family Medicine

## 2016-04-14 DIAGNOSIS — E7800 Pure hypercholesterolemia, unspecified: Secondary | ICD-10-CM | POA: Diagnosis not present

## 2016-04-14 DIAGNOSIS — E559 Vitamin D deficiency, unspecified: Secondary | ICD-10-CM | POA: Diagnosis not present

## 2016-04-15 LAB — LIPID PANEL
CHOL/HDL RATIO: 2.8 ratio (ref 0.0–5.0)
Cholesterol, Total: 118 mg/dL (ref 100–199)
HDL: 42 mg/dL (ref >39)
LDL Calculated: 59 mg/dL (ref 0–99)
Triglycerides: 83 mg/dL (ref 0–149)
VLDL Cholesterol Cal: 17 mg/dL (ref 5–40)

## 2016-04-15 LAB — VITAMIN D 25 HYDROXY (VIT D DEFICIENCY, FRACTURES): Vit D, 25-Hydroxy: 52.5 ng/mL (ref 30.0–100.0)

## 2016-04-21 DIAGNOSIS — I10 Essential (primary) hypertension: Secondary | ICD-10-CM | POA: Diagnosis not present

## 2016-04-25 DIAGNOSIS — Z79899 Other long term (current) drug therapy: Secondary | ICD-10-CM | POA: Diagnosis not present

## 2016-04-25 DIAGNOSIS — M0579 Rheumatoid arthritis with rheumatoid factor of multiple sites without organ or systems involvement: Secondary | ICD-10-CM | POA: Diagnosis not present

## 2016-05-01 DIAGNOSIS — M79642 Pain in left hand: Secondary | ICD-10-CM | POA: Diagnosis not present

## 2016-05-01 DIAGNOSIS — M0579 Rheumatoid arthritis with rheumatoid factor of multiple sites without organ or systems involvement: Secondary | ICD-10-CM | POA: Diagnosis not present

## 2016-05-01 DIAGNOSIS — M79641 Pain in right hand: Secondary | ICD-10-CM | POA: Diagnosis not present

## 2016-05-12 ENCOUNTER — Other Ambulatory Visit: Payer: Self-pay | Admitting: Family Medicine

## 2016-05-14 ENCOUNTER — Ambulatory Visit: Payer: Medicare Other | Attending: Rheumatology | Admitting: Occupational Therapy

## 2016-05-14 DIAGNOSIS — M6281 Muscle weakness (generalized): Secondary | ICD-10-CM | POA: Insufficient documentation

## 2016-05-14 DIAGNOSIS — M25642 Stiffness of left hand, not elsewhere classified: Secondary | ICD-10-CM

## 2016-05-14 NOTE — Therapy (Signed)
[facility] [address] [place], [address] Phone: [phone]   Fax:  [phone]  Occupational Therapy Evaluation  Patient Details  Name: Juan Fernandez MRN: [id number] Date of Birth: [birth year] Referring Provider: Jefm Bryant  Encounter Date: 05/14/2016      OT End of Session - 05/14/16 0907    Visit Number 1   Number of Visits 10   Date for OT Re-Evaluation 06/18/16   OT Start Time 0810   OT Stop Time 0855   OT Time Calculation (min) 45 min   Activity Tolerance Patient tolerated treatment well   Behavior During Therapy [facility] for tasks assessed/performed      Past Medical History:  Diagnosis Date  . AAA (abdominal aortic aneurysm) ([place])   . COPD (chronic obstructive pulmonary disease) ([place])   . DVT (deep venous thrombosis) ([place])   . Elevated PSA   . HTN (hypertension)   . PE (pulmonary embolism)   . Prostate cancer [facility])     Past Surgical History:  Procedure Laterality Date  . AORTA SURGERY  2016   Dr. Sammuel Hines at [facility]  . CATARACT EXTRACTION    . ivc filter  2010  . KNEE SURGERY Left   . PROSTATE BIOPSY  05/19/2012   Gleason 3+3=6 l base, 3+4=7 RM and LA, RMB  . TOTAL HIP ARTHROPLASTY Right     There were no vitals filed for this visit.      Subjective Assessment - 05/14/16 0818    Subjective  Cat scratch or bite - did home care - did not take antibiotics - Swelling in L hand about May - and since then hand is stiff - cannot make fist - the fingers cannot bend    Patient Stated Goals Would like to do woodwork, throwing football, hit baseball - pick up heavy objects, card games    Currently in Pain? Yes   Pain Score 0-No pain           [facility] OT Assessment - 05/14/16 0001      Assessment   Diagnosis L hand stiffness   Referring Provider [place]le   Onset Date 10/15/15     Home  Environment   Lives With Alone     Prior Function   Vocation Retired   Leisure R hand dominant-  likes sports ,setup small village car nd train - sports , woodwork      Strength   Right Hand Grip (lbs) 60   Right Hand Lateral Pinch 14 lbs   Right Hand 3 Point Pinch 10 lbs   Left Hand Grip (lbs) 8   Left Hand Lateral Pinch 8 lbs   Left Hand 3 Point Pinch 8 lbs     Left Hand AROM   L Index  MCP 0-90 90 Degrees   L Index PIP 0-100 40 Degrees   L Long  MCP 0-90 100 Degrees   L Long PIP 0-100 30 Degrees   L Ring  MCP 0-90 100 Degrees   L Ring PIP 0-100 25 Degrees   L Little  MCP 0-90 100 Degrees   L Little PIP 0-100 0 Degrees       Heat PROM  PIP flexion  Composite flexion PROM  Each digit  AROM blocked  For PIP's flexion Full fist AROM to 4 cm foam block  2-3 x day for 10 reps each hold 3 sec  OT Education - 05/14/16 0907    Education provided Yes   Education Details HEP ed on and findings of eval    Person(s) Educated Patient   Methods Explanation;Demonstration;Tactile cues;Verbal cues;Handout   Comprehension Verbal cues required;Returned demonstration;Verbalized understanding          OT Short Term Goals - 05/14/16 2134      OT SHORT TERM GOAL #1   Title Pt to be ind in HEP to gain 10-15 degrees of flexion at PIP's to grasp around 3 -4 cm cylinder object   Baseline see flowsheet for PIP flexion    Time 3   Period Weeks   Status New           OT Long Term Goals - 05/14/16 2135      OT LONG TERM GOAL #1   Title Pt show increase L grip strenght by 5-10 lbs for grip to  do hobbies like sports, woodwork , and small cars/village with more ease   Baseline L 8 and R 60 lbs    Time 5   Period Weeks   Status New     OT LONG TERM GOAL #2   Title Pt to be ind in HEP to increase PIP 's flexion and strength to show progress or maintain progress at home    Baseline Very little knowledge on correct HEP   Time 5   Period Weeks   Status New               Plan - 05/14/16 0908    Clinical Impression Statement Pt  present with extremely tightness in PIP's of all digits in L hand - pt is R hand dominant- pt's MC flexion WNL - pt report he had his cat bite or scratch him in May - and then had  swelling in the hand  and redness on dorsal hand - did not seek medical care - his daughter and he did home care- now pt present with stiffness mostly in PIP flexion , decrease strength , functional use  of L hand in ADL's and IADL's    Rehab Potential Good   OT Frequency 2x / week   OT Duration 4 weeks   OT Treatment/Interventions Self-care/ADL training;Fluidtherapy;Splinting;Patient/family education;Therapeutic exercises;Parrafin;Manual Therapy;Passive range of motion;Moist Heat   Plan Assess progress with HEP   OT Home Exercise Plan see pt instruction   Consulted and Agree with Plan of Care Patient      Patient will benefit from skilled therapeutic intervention in order to improve the following deficits and impairments:  Decreased range of motion, Impaired flexibility, Decreased scar mobility  Visit Diagnosis: Stiffness of left hand, not elsewhere classified - Plan: Ot plan of care cert/re-cert  Muscle weakness (generalized) - Plan: Ot plan of care cert/re-cert    Problem List Patient Active Problem List   Diagnosis Date Noted  . Iron deficiency anemia 12/03/2015  . Allergic rhinitis 12/03/2015  . B12 deficiency 12/03/2015  . Bilateral cataracts 12/03/2015  . Diverticulosis of colon 12/03/2015  . Acid reflux 12/03/2015  . Old myocardial infarction 12/03/2015  . Migraine 12/03/2015  . Asthma without status asthmaticus 03/20/2014  . CAFL (chronic airflow limitation) ([place]) 03/20/2014  . OP (osteoporosis) 02/14/2014  . Atrial fibrillation by electrocardiogram ([place]) 09/30/2013  . Prostate cancer ([place]) 10/06/2012  . HLD (hyperlipidemia) 12/21/2009  . Avitaminosis D 05/04/2009  . Rheumatoid arthritis ([place]) 03/10/2009    Rosalyn Gess OTR/L,CLT 05/14/2016, 9:47 PM  [facility] [address] [place], [address] Phone: [phone]   Fax:  [phone]  Name: BROADY ALMAS MRN: [id number] Date of Birth: 1945-10-02

## 2016-05-14 NOTE — Patient Instructions (Signed)
Heat PROM  PIP flexion  Composite flexion PROM  Each digit  AROM blocked  For PIP's flexion Full fist AROM to 4 cm foam block  2-3 x day for 10 reps each hold 3 sec

## 2016-05-20 ENCOUNTER — Ambulatory Visit: Payer: Medicare Other | Admitting: Occupational Therapy

## 2016-05-23 ENCOUNTER — Ambulatory Visit: Payer: Medicare Other | Admitting: Occupational Therapy

## 2016-05-26 DIAGNOSIS — I10 Essential (primary) hypertension: Secondary | ICD-10-CM | POA: Diagnosis not present

## 2016-05-26 DIAGNOSIS — T82330D Leakage of aortic (bifurcation) graft (replacement), subsequent encounter: Secondary | ICD-10-CM | POA: Diagnosis not present

## 2016-05-26 DIAGNOSIS — M069 Rheumatoid arthritis, unspecified: Secondary | ICD-10-CM | POA: Diagnosis not present

## 2016-05-26 DIAGNOSIS — J449 Chronic obstructive pulmonary disease, unspecified: Secondary | ICD-10-CM | POA: Diagnosis not present

## 2016-05-26 DIAGNOSIS — I4891 Unspecified atrial fibrillation: Secondary | ICD-10-CM | POA: Diagnosis not present

## 2016-05-26 DIAGNOSIS — M810 Age-related osteoporosis without current pathological fracture: Secondary | ICD-10-CM | POA: Diagnosis not present

## 2016-07-31 DIAGNOSIS — M0579 Rheumatoid arthritis with rheumatoid factor of multiple sites without organ or systems involvement: Secondary | ICD-10-CM | POA: Diagnosis not present

## 2016-08-28 DIAGNOSIS — Z8249 Family history of ischemic heart disease and other diseases of the circulatory system: Secondary | ICD-10-CM | POA: Diagnosis not present

## 2016-08-28 DIAGNOSIS — N281 Cyst of kidney, acquired: Secondary | ICD-10-CM | POA: Diagnosis not present

## 2016-08-28 DIAGNOSIS — I481 Persistent atrial fibrillation: Secondary | ICD-10-CM | POA: Diagnosis not present

## 2016-08-28 DIAGNOSIS — M069 Rheumatoid arthritis, unspecified: Secondary | ICD-10-CM | POA: Diagnosis not present

## 2016-08-28 DIAGNOSIS — R918 Other nonspecific abnormal finding of lung field: Secondary | ICD-10-CM | POA: Diagnosis not present

## 2016-08-28 DIAGNOSIS — M810 Age-related osteoporosis without current pathological fracture: Secondary | ICD-10-CM | POA: Diagnosis not present

## 2016-08-28 DIAGNOSIS — Z7982 Long term (current) use of aspirin: Secondary | ICD-10-CM | POA: Diagnosis not present

## 2016-08-28 DIAGNOSIS — J449 Chronic obstructive pulmonary disease, unspecified: Secondary | ICD-10-CM | POA: Diagnosis not present

## 2016-08-28 DIAGNOSIS — Z7901 Long term (current) use of anticoagulants: Secondary | ICD-10-CM | POA: Diagnosis not present

## 2016-08-28 DIAGNOSIS — K219 Gastro-esophageal reflux disease without esophagitis: Secondary | ICD-10-CM | POA: Diagnosis not present

## 2016-08-28 DIAGNOSIS — Z87891 Personal history of nicotine dependence: Secondary | ICD-10-CM | POA: Diagnosis not present

## 2016-08-28 DIAGNOSIS — K625 Hemorrhage of anus and rectum: Secondary | ICD-10-CM | POA: Diagnosis not present

## 2016-08-28 DIAGNOSIS — I716 Thoracoabdominal aortic aneurysm, without rupture: Secondary | ICD-10-CM | POA: Diagnosis not present

## 2016-08-28 DIAGNOSIS — Z7983 Long term (current) use of bisphosphonates: Secondary | ICD-10-CM | POA: Diagnosis not present

## 2016-08-28 DIAGNOSIS — I723 Aneurysm of iliac artery: Secondary | ICD-10-CM | POA: Diagnosis not present

## 2016-08-28 DIAGNOSIS — I10 Essential (primary) hypertension: Secondary | ICD-10-CM | POA: Diagnosis not present

## 2016-08-28 DIAGNOSIS — Z7951 Long term (current) use of inhaled steroids: Secondary | ICD-10-CM | POA: Diagnosis not present

## 2016-09-12 ENCOUNTER — Encounter: Payer: Self-pay | Admitting: *Deleted

## 2016-10-30 DIAGNOSIS — M0579 Rheumatoid arthritis with rheumatoid factor of multiple sites without organ or systems involvement: Secondary | ICD-10-CM | POA: Diagnosis not present

## 2016-11-06 DIAGNOSIS — M0579 Rheumatoid arthritis with rheumatoid factor of multiple sites without organ or systems involvement: Secondary | ICD-10-CM | POA: Diagnosis not present

## 2016-11-06 DIAGNOSIS — M79641 Pain in right hand: Secondary | ICD-10-CM | POA: Diagnosis not present

## 2016-11-06 DIAGNOSIS — J449 Chronic obstructive pulmonary disease, unspecified: Secondary | ICD-10-CM | POA: Diagnosis not present

## 2016-11-06 DIAGNOSIS — M810 Age-related osteoporosis without current pathological fracture: Secondary | ICD-10-CM | POA: Diagnosis not present

## 2016-11-06 DIAGNOSIS — Z79899 Other long term (current) drug therapy: Secondary | ICD-10-CM | POA: Diagnosis not present

## 2016-11-06 DIAGNOSIS — M79642 Pain in left hand: Secondary | ICD-10-CM | POA: Diagnosis not present

## 2016-12-09 ENCOUNTER — Other Ambulatory Visit: Payer: Self-pay

## 2016-12-09 NOTE — Telephone Encounter (Signed)
Patient needs a refill on Simvastatin. He reports that he is out of medication. He is a Dr. Caryn Section pateint. He uses [facility]

## 2016-12-12 MED ORDER — SIMVASTATIN 20 MG PO TABS: 20.0000 mg | ORAL_TABLET | Freq: Every day | ORAL | 0 refills | Status: DC

## 2016-12-12 NOTE — Telephone Encounter (Signed)
Pablo called stating pt had come to pharmacy to pick up simvastatin (ZOCOR) 20 MG tablet. Pt requested this to be refilled on 12/09/16.  LOV: 04/02/16 Dr. Caryn Section isn't in the office this week. Can another provider refill medication and our office can contact pt to schedule F/U if needed? Please advise. Thanks TNP

## 2016-12-12 NOTE — Telephone Encounter (Signed)
Please review-aa 

## 2016-12-18 ENCOUNTER — Ambulatory Visit: Payer: Medicare Other | Admitting: Family Medicine

## 2017-01-02 ENCOUNTER — Encounter: Payer: Self-pay | Admitting: *Deleted

## 2017-01-12 ENCOUNTER — Ambulatory Visit: Payer: TRICARE For Life (TFL) | Admitting: Radiation Oncology

## 2017-01-26 ENCOUNTER — Other Ambulatory Visit: Payer: Self-pay | Admitting: *Deleted

## 2017-01-26 ENCOUNTER — Inpatient Hospital Stay: Payer: Medicare Other | Attending: Radiation Oncology

## 2017-01-26 DIAGNOSIS — C61 Malignant neoplasm of prostate: Secondary | ICD-10-CM

## 2017-01-26 LAB — PSA: Prostatic Specific Antigen: 0.09 ng/mL (ref 0.00–4.00)

## 2017-01-26 NOTE — Progress Notes (Unsigned)
PSA

## 2017-01-29 DIAGNOSIS — M0579 Rheumatoid arthritis with rheumatoid factor of multiple sites without organ or systems involvement: Secondary | ICD-10-CM | POA: Diagnosis not present

## 2017-01-29 DIAGNOSIS — Z79899 Other long term (current) drug therapy: Secondary | ICD-10-CM | POA: Diagnosis not present

## 2017-02-02 ENCOUNTER — Ambulatory Visit: Payer: TRICARE For Life (TFL) | Admitting: Radiation Oncology

## 2017-02-05 DIAGNOSIS — Z87891 Personal history of nicotine dependence: Secondary | ICD-10-CM | POA: Diagnosis not present

## 2017-02-05 DIAGNOSIS — Z95828 Presence of other vascular implants and grafts: Secondary | ICD-10-CM | POA: Diagnosis not present

## 2017-02-05 DIAGNOSIS — I716 Thoracoabdominal aortic aneurysm, without rupture: Secondary | ICD-10-CM | POA: Diagnosis not present

## 2017-02-05 DIAGNOSIS — E785 Hyperlipidemia, unspecified: Secondary | ICD-10-CM | POA: Diagnosis not present

## 2017-02-05 DIAGNOSIS — Z09 Encounter for follow-up examination after completed treatment for conditions other than malignant neoplasm: Secondary | ICD-10-CM | POA: Diagnosis not present

## 2017-02-05 DIAGNOSIS — Z006 Encounter for examination for normal comparison and control in clinical research program: Secondary | ICD-10-CM | POA: Diagnosis not present

## 2017-02-05 DIAGNOSIS — T82330A Leakage of aortic (bifurcation) graft (replacement), initial encounter: Secondary | ICD-10-CM | POA: Diagnosis not present

## 2017-02-05 DIAGNOSIS — I10 Essential (primary) hypertension: Secondary | ICD-10-CM | POA: Diagnosis not present

## 2017-02-05 DIAGNOSIS — I2510 Atherosclerotic heart disease of native coronary artery without angina pectoris: Secondary | ICD-10-CM | POA: Diagnosis not present

## 2017-02-09 ENCOUNTER — Ambulatory Visit
Admission: RE | Admit: 2017-02-09 | Discharge: 2017-02-09 | Disposition: A | Discharge status: Home / Self Care | Payer: Medicare Other | Source: Ambulatory Visit | Attending: Radiation Oncology | Admitting: Radiation Oncology

## 2017-02-09 VITALS — BP 124/72 | HR 90 | Temp 95.2°F | Resp 22 | Wt 118.1 lb

## 2017-02-09 DIAGNOSIS — C61 Malignant neoplasm of prostate: Secondary | ICD-10-CM | POA: Insufficient documentation

## 2017-02-09 DIAGNOSIS — Z923 Personal history of irradiation: Secondary | ICD-10-CM | POA: Insufficient documentation

## 2017-02-09 NOTE — Progress Notes (Signed)
Radiation Oncology Follow up Note  Name: Juan Fernandez   Date:   02/09/2017 MRN:  [id number] DOB: 1946/05/11    This 71 y.o. male presents to the clinic today for for your follow-up status post IM RT radiation therapy for a Gleason 7 adenocarcinoma the prostate.  REFERRING PROVIDER: Alexis Frock, MD  HPI: Patient is a 71 year old male now out 4 years having completed IM RT radiation therapy for Gleason 7 (3+4) adenocarcinoma the prostate presenting the PSA 5.7. Seen today in routine follow-up he continues to do well. He specifically denies diarrhea dysuria or any other GI/GU complaints. Most recent PSA this month was 1.03.  COMPLICATIONS OF TREATMENT: none  FOLLOW UP COMPLIANCE: keeps appointments   PHYSICAL EXAM:  BP 124/72   Pulse 90   Temp (!) 95.2 F (35.1 C)   Resp (!) 22   Wt 118 lb 0.9 oz (53.5 kg)   BMI 18.49 kg/m  On rectal exam rectal sphincter tone is good. Prostate is smooth contracted without evidence of nodularity or mass. Sulcus is preserved bilaterally. No discrete nodularity is identified. No other rectal abnormalities are noted. Well-developed well-nourished patient in NAD. HEENT reveals PERLA, EOMI, discs not visualized.  Oral cavity is clear. No oral mucosal lesions are identified. Neck is clear without evidence of cervical or supraclavicular adenopathy. Lungs are clear to A&P. Cardiac examination is essentially unremarkable with regular rate and rhythm without murmur rub or thrill. Abdomen is benign with no organomegaly or masses noted. Motor sensory and DTR levels are equal and symmetric in the upper and lower extremities. Cranial nerves II through XII are grossly intact. Proprioception is intact. No peripheral adenopathy or edema is identified. No motor or sensory levels are noted. Crude visual fields are within normal range.  RADIOLOGY RESULTS: No current films for review  PLAN: At the present time patient is doing well under good biochemical control of his  prostate cancer. I am please was overall progress. With his PSA this low 4 years out I'm going to discontinue follow-up care. He will have by his PMD PSAs drawn. I would be happy to reevaluate the patient any time should further treatment be indicated.  I would like to take this opportunity to thank you for allowing me to participate in the care of your patient.Armstead Peaks., MD

## 2017-03-30 ENCOUNTER — Other Ambulatory Visit: Payer: Self-pay | Admitting: Physician Assistant

## 2017-04-03 ENCOUNTER — Encounter: Payer: Self-pay | Admitting: Family Medicine

## 2017-04-03 NOTE — Progress Notes (Deleted)
Patient: Juan Fernandez, Male    DOB: 1945/12/29, 71 y.o.   MRN: [id number] Visit Date: 04/03/2017  Today's Provider: [name], MD   No chief complaint on file.  Subjective:    Annual wellness visit Nahun BRISTON LAX is a 71 y.o. male. He feels {DESC; WELL/FAIRLY WELL/POORLY:18703}. He reports exercising ***. He reports he is sleeping {DESC; WELL/FAIRLY WELL/POORLY:18703}.  -----------------------------------------------------------   Hypertension, follow-up:  BP Readings from Last 3 Encounters:  02/09/17 124/72  04/02/16 (!) 128/58  12/24/15 136/65    He was last seen for hypertension 1 years ago.  BP at that visit was 90/60. Management since that visit includes; stopped Flomax due to low Bp.He reports {excellent/good/fair/poor:19665} compliance with treatment. He {ACTION; IS/IS [id number]} having side effects. *** He {is/is not:9024} exercising. He {is/is not:9024} adherent to low salt diet.   Outside blood pressures are ***. He is experiencing {Symptoms; cardiac:12860}.  Patient denies {Symptoms; cardiac:12860}.   Cardiovascular risk factors include {cv risk factors:510}.  Use of agents associated with hypertension: {bp agents assoc with hypertension:511::"none"}.   ------------------------------------------------------------------------    Lipid/Cholesterol, Follow-up:   Last seen for this 1 years ago.  Management since that visit includes; labs checked, no changes.  Last Lipid Panel:    Component Value Date/Time   CHOL 118 04/14/2016 0833   TRIG 83 04/14/2016 0833   HDL 42 04/14/2016 0833   CHOLHDL 2.8 04/14/2016 0833   LDLCALC 59 04/14/2016 0833    He reports {excellent/good/fair/poor:19665} compliance with treatment. He {ACTION; IS/IS [id number]} having side effects. ***  Wt Readings from Last 3 Encounters:  02/09/17 118 lb 0.9 oz (53.5 kg)  04/02/16 126 lb (57.2 kg)  12/24/15 123 lb 10.9 oz (56.1 kg)     ------------------------------------------------------------------------ Avitaminosis D From 04/02/2016-labs checked, no changes.     Review of Systems  Social History   Social History  . Marital status: Married    Spouse name: N/A  . Number of children: 3  . Years of education: N/A   Occupational History  . Not on file.   Social History Main Topics  . Smoking status: Former Smoker    Packs/day: 1.00    Start date: 06/16/1962    Quit date: 10/07/2003  . Smokeless tobacco: Not on file  . Alcohol use No  . Drug use: No  . Sexual activity: Not on file   Other Topics Concern  . Not on file   Social History Narrative  . No narrative on file    Past Medical History:  Diagnosis Date  . AAA (abdominal aortic aneurysm) ([place])   . COPD (chronic obstructive pulmonary disease) ([place])   . DVT (deep venous thrombosis) ([place])   . Elevated PSA   . HTN (hypertension)   . PE (pulmonary embolism)   . Prostate cancer [facility])      Patient Active Problem List   Diagnosis Date Noted  . Iron deficiency anemia 12/03/2015  . Allergic rhinitis 12/03/2015  . B12 deficiency 12/03/2015  . Bilateral cataracts 12/03/2015  . Diverticulosis of colon 12/03/2015  . Acid reflux 12/03/2015  . Old myocardial infarction 12/03/2015  . Migraine 12/03/2015  . Asthma without status asthmaticus 03/20/2014  . CAFL (chronic airflow limitation) ([place]) 03/20/2014  . OP (osteoporosis) 02/14/2014  . Atrial fibrillation by electrocardiogram ([place]) 09/30/2013  . Prostate cancer ([place]) 10/06/2012  . HLD (hyperlipidemia) 12/21/2009  . Avitaminosis D 05/04/2009  . Rheumatoid arthritis ([place]) 03/10/2009    Past Surgical History:  Procedure  Laterality Date  . AORTA SURGERY  2016   [facility] Sammuel [facility]ines at [facility]  . CATARACT EXTRACTION    . ivc filter  2010  . KNEE SURGERY Left   . PROSTATE BIOPSY  05/19/2012   Gleason 3+3=6 l base, 3+4=7 RM and LA, RMB  . TOTAL HIP ARTHROPLASTY Right     His family history includes  Lymphoma in his mother.      Current Outpatient Prescriptions:  .  alendronate (FOSAMAX) 10 MG tablet, Take 70 mg by mouth., Disp: , Rfl:  .  apixaban (ELIQUIS) 5 MG TABS tablet, TAKE 1 TABLET TWICE A DAY, Disp: , Rfl:  .  aspirin 325 MG tablet, Take 325 mg by mouth daily., Disp: , Rfl:  .  calcium carbonate (OS-CAL) 600 MG TABS, Take 600 mg by mouth 2 (two) times daily with a meal., Disp: , Rfl:  .  Cholecalciferol (VITAMIN D-1000 MAX ST) 1000 UNITS tablet, Take by mouth., Disp: , Rfl:  .  dofetilide (TIKOSYN) 500 MCG capsule, TAKE 1 CAPSULE EVERY 12 HOURS, Disp: , Rfl:  .  enalapril (VASOTEC) 5 MG tablet, Take 5 mg by mouth 2 (two) times daily., Disp: , Rfl:  .  ferrous fumarate (HEMOCYTE - 106 MG FE) 325 (106 FE) MG TABS, Take 1 tablet by mouth., Disp: , Rfl:  .  folic acid (FOLVITE) 1 MG tablet, Take 1 mg by mouth daily., Disp: , Rfl:  .  hydroxychloroquine (PLAQUENIL) 200 MG tablet, Take by mouth daily., Disp: , Rfl:  .  methotrexate (RHEUMATREX) 2.5 MG tablet, Take 2.5 mg by mouth once a week. Caution:Chemotherapy. Protect from light., Disp: , Rfl:  .  metoprolol (LOPRESSOR) 50 MG tablet, TAKE ONE-HALF (1/2) TABLET TWICE A DAY, Disp: 90 tablet, Rfl: 3 .  NASCOBAL 500 MCG/0.1ML SOLN, USE ONE SPRAY IN 1 NOSTRIL EVERY WEEK, Disp: 1.3 mL, Rfl: 12 .  NEXIUM 40 MG capsule, TAKE 1 CAPSULE TWICE A DAY (Patient taking differently: TAKE 1 CAPSULE  A DAY), Disp: 180 capsule, Rfl: 4 .  simvastatin (ZOCOR) 20 MG tablet, Take 1 tablet (20 mg total) by mouth daily., Disp: 90 tablet, Rfl: 1 .  SPIRIVA HANDIHALER 18 MCG inhalation capsule, INHALE THE CONTENTS OF 1 CAPSULE THROUGH INHALATION DEVICE DAILY, Disp: 90 capsule, Rfl: 4 .  warfarin (COUMADIN) 5 MG tablet, Take 5 mg by mouth daily. Takes 1/2 tablet Tuesday, Thursday, Sat and Sunday.  Takes full tablet Monday, Wednesday and Friday, Disp: , Rfl:   Patient Care Team: Birdie Sons, MD as PCP - General (Family Medicine) Thomasene Ripple, MD as  Attending Physician (Vascular Surgery) Emmaline Kluver., MD (Rheumatology) Christoper Allegra, NP as Nurse Practitioner (Cardiology) Alexis Frock, MD as Consulting Physician (Urology)     Objective:   Vitals: There were no vitals taken for this visit.  Physical Exam  Activities of Daily Living No flowsheet data found.  Fall Risk Assessment Fall Risk  02/09/2017 04/02/2016 12/25/2014  Falls in the past year? No No No     Depression Screen PHQ 2/9 Scores 02/09/2017 04/02/2016 12/25/2014  PHQ - 2 Score 0 0 0    Cognitive Testing - 6-CIT  Correct? Score   What year is it? {yes no:22349} {0-4:31231} 0 or 4  What month is it? {yes no:22349} {0-3:21082} 0 or 3  Memorize:    [address]ia [address],  41,  [place],      What time is it? (within 1 hour) {yes no:22349} {0-3:21082}  0 or 3  Count backwards from 20 {yes no:22349} {0-4:31231} 0, 2, or 4  Name the months of the year {yes no:22349} {0-4:31231} 0, 2, or 4  Repeat name & address above {yes no:22349} {0-10:5044} 0, 2, 4, 6, 8, or 10       TOTAL SCORE  ***/28   Interpretation:  {normal/abnormal:11317::"Normal"}  Normal (0-7) Abnormal (8-28)    Audit-C Alcohol Use Screening  Question Answer Points  How often do you have alcoholic drink? {Frequencies:19612::"never"} {Score; 0-4:31231::"0"}  On days you do drink alcohol, how many drinks do you typically consume? *** {Score; 0-4:31231::"0"}  How oftey will you drink 6 or more in a total? {Frequencies:19612::"never"} {Score; 0-4:31231::"0"}  Total Score:  {NUMBERS 0-12:18577::"0"}   A score of 3 or more in women, and 4 or more in men indicates increased risk for alcohol abuse, EXCEPT if all of the points are from question 1.     Assessment & Plan:     Annual Wellness Visit  Reviewed patient's Family Medical History Reviewed and updated list of patient's medical providers Assessment of cognitive impairment was done Assessed patient's functional ability Established a  written schedule for health screening [place] Completed and Reviewed  Exercise Activities and Dietary recommendations Goals    None      Immunization History  Administered Date(s) Administered  . Pneumococcal Conjugate-13 07/14/2014  . Pneumococcal Polysaccharide-23 08/29/2009    Health Maintenance  Topic Date Due  . Hepatitis C Screening  04-Oct-1945  . TETANUS/TDAP  09/24/1963  . COLONOSCOPY  09/24/1994  . PNA vac Low Risk Adult (2 of 2 - PPSV23) 07/15/2015  . INFLUENZA VACCINE  01/14/2017     Discussed health benefits of physical activity, and encouraged him to engage in regular exercise appropriate for his age and condition.    ------------------------------------------------------------------------------------------------------------    [name], MD  [place]

## 2017-04-17 ENCOUNTER — Ambulatory Visit (INDEPENDENT_AMBULATORY_CARE_PROVIDER_SITE_OTHER): Payer: Medicare Other | Admitting: Family Medicine

## 2017-04-17 ENCOUNTER — Encounter: Payer: Self-pay | Admitting: Family Medicine

## 2017-04-17 VITALS — BP 110/70 | HR 97 | Temp 98.0°F | Resp 16 | Ht 67.0 in | Wt 120.0 lb

## 2017-04-17 DIAGNOSIS — Z1211 Encounter for screening for malignant neoplasm of colon: Secondary | ICD-10-CM

## 2017-04-17 DIAGNOSIS — M810 Age-related osteoporosis without current pathological fracture: Secondary | ICD-10-CM

## 2017-04-17 DIAGNOSIS — Z1159 Encounter for screening for other viral diseases: Secondary | ICD-10-CM | POA: Diagnosis not present

## 2017-04-17 DIAGNOSIS — R0600 Dyspnea, unspecified: Secondary | ICD-10-CM

## 2017-04-17 DIAGNOSIS — I4891 Unspecified atrial fibrillation: Secondary | ICD-10-CM

## 2017-04-17 DIAGNOSIS — Z0000 Encounter for general adult medical examination without abnormal findings: Secondary | ICD-10-CM

## 2017-04-17 DIAGNOSIS — E7800 Pure hypercholesterolemia, unspecified: Secondary | ICD-10-CM | POA: Diagnosis not present

## 2017-04-17 DIAGNOSIS — Z125 Encounter for screening for malignant neoplasm of prostate: Secondary | ICD-10-CM | POA: Diagnosis not present

## 2017-04-17 DIAGNOSIS — J449 Chronic obstructive pulmonary disease, unspecified: Secondary | ICD-10-CM

## 2017-04-17 DIAGNOSIS — M069 Rheumatoid arthritis, unspecified: Secondary | ICD-10-CM

## 2017-04-17 MED ORDER — DOXYCYCLINE HYCLATE 100 MG PO TABS: 100.0000 mg | ORAL_TABLET | Freq: Two times a day (BID) | ORAL | 0 refills | Status: AC

## 2017-04-17 MED ORDER — UMECLIDINIUM-VILANTEROL 62.5-25 MCG/INH IN AEPB: 1.0000 | INHALATION_SPRAY | Freq: Every day | RESPIRATORY_TRACT | 5 refills | Status: DC

## 2017-04-17 NOTE — Patient Instructions (Signed)
  Medications Discontinued During This Encounter  Medication Reason  . warfarin (COUMADIN) 5 MG tablet Completed Course  . SPIRIVA HANDIHALER 18 MCG inhalation capsule     Meds ordered this encounter  Medications  . umeclidinium-vilanterol (ANORO ELLIPTA) 62.5-25 MCG/INH AEPB    Sig: Inhale 1 puff into the lungs daily.    Dispense:  60 each    Refill:  5    Adult vaccines due  Topic Date Due  . TETANUS/TDAP  09/24/1963    Health Maintenance Due  Topic Date Due  . Hepatitis C Screening  09-03-1945  . TETANUS/TDAP  09/24/1963  . COLONOSCOPY  09/24/1994  . PNA vac Low Risk Adult (2 of 2 - PPSV23) 07/15/2015

## 2017-04-17 NOTE — Progress Notes (Signed)
Patient: Juan Fernandez, Male    DOB: July 08, 1945, 71 y.o.   MRN: [id number] Visit Date: 04/17/2017  Today's Provider: [name], MD   Chief Complaint  Patient presents with  . Medicare Wellness  . Hypertension  . Hyperlipidemia   Subjective:    Annual wellness visit Juan Fernandez is a 71 y.o. male. He feels fairly well. He reports exercising none. He reports he is sleeping fairly well.  -----------------------------------------------------------   Hypertension, follow-up:  BP Readings from Last 3 Encounters:  04/17/17 110/70  02/09/17 124/72  04/02/16 (!) 128/58    He was last seen for hypertension 1 years ago.  BP at that visit was 90/60. Management since that visit includes; stopped Flomax due to low Bp.He reports good compliance with treatment. He is not having side effects. none He is not exercising. He is adherent to low salt diet.   Outside blood pressures are none. He is experiencing none.  Patient denies none.   Cardiovascular risk factors include advanced age  Use of agents associated with hypertension: none.   ----------------------------------------------------------------     Lipid/Cholesterol, Follow-up:   Last seen for this 1 years ago.  Management since that visit includes; labs checked, no changes.  Last Lipid Panel:    Component Value Date/Time   CHOL 118 04/14/2016 0833   TRIG 83 04/14/2016 0833   HDL 42 04/14/2016 0833   CHOLHDL 2.8 04/14/2016 0833   LDLCALC 59 04/14/2016 0833    He reports good compliance with treatment. He is not having side effects. none  Wt Readings from Last 3 Encounters:  04/17/17 120 lb (54.4 kg)  02/09/17 118 lb 0.9 oz (53.5 kg)  04/02/16 126 lb (57.2 kg)    ----------------------------------------------------------------   Avitaminosis D From 04/02/2017-labs checked, no changes.  Lab Results  Component Value Date   VD25OH 52.5 04/14/2016   Follow up COPD  State breathing has  been a little worse over the last several months. He would like to try a different inhaler. Doesn't feel Spiriva is working very well anymore.    Review of Systems  Constitutional: Positive for activity change and fatigue.  HENT: Negative.   Eyes: Negative.   Respiratory: Positive for shortness of breath and wheezing.   Cardiovascular: Negative.   Gastrointestinal: Negative.   Endocrine: Negative.   Genitourinary: Negative.   Musculoskeletal: Negative.   Skin: Negative.   Allergic/Immunologic: Negative.   Neurological: Negative.   Hematological: Negative.     Social History   Social History  . Marital status: Married    Spouse name: N/A  . Number of children: 3  . Years of education: N/A   Occupational History  . Not on file.   Social History Main Topics  . Smoking status: Former Smoker    Packs/day: 1.00    Start date: 06/16/1962    Quit date: 10/07/2003  . Smokeless tobacco: Former Systems developer  . Alcohol use No  . Drug use: No  . Sexual activity: Not on file   Other Topics Concern  . Not on file   Social History Narrative  . No narrative on file    Past Medical History:  Diagnosis Date  . AAA (abdominal aortic aneurysm) ([place])   . COPD (chronic obstructive pulmonary disease) ([place])   . DVT (deep venous thrombosis) ([place])   . Elevated PSA   . HTN (hypertension)   . PE (pulmonary embolism)   . Prostate cancer ([place])  Patient Active Problem List   Diagnosis Date Noted  . Iron deficiency anemia 12/03/2015  . Allergic rhinitis 12/03/2015  . B12 deficiency 12/03/2015  . Bilateral cataracts 12/03/2015  . Diverticulosis of colon 12/03/2015  . GERD (gastroesophageal reflux disease) 12/03/2015  . Old myocardial infarction 12/03/2015  . Migraine 12/03/2015  . Asthma without status asthmaticus 03/20/2014  . CAFL (chronic airflow limitation) ([place]) 03/20/2014  . Anemia 03/20/2014  . OP (osteoporosis) 02/14/2014  . Atrial fibrillation by electrocardiogram ([place])  09/30/2013  . Prostate cancer ([place]) 10/06/2012  . HLD (hyperlipidemia) 12/21/2009  . Avitaminosis D 05/04/2009  . Rheumatoid arthritis ([place]) 03/10/2009    Past Surgical History:  Procedure Laterality Date  . AORTA SURGERY  2016   [facility] Sammuel H[facility]  . CATARACT EXTRACTION    . ivc filter  2010  . KNEE SURGERY Left   . PROSTATE BIOPSY  05/19/2012   Gleason 3+3=6 l base, 3+4=7 RM and LA, RMB  . TOTAL HIP ARTHROPLASTY Right     His family history includes Lymphoma in his mother.      Current Outpatient Prescriptions:  .  alendronate (FOSAMAX) 10 MG tablet, Take 70 mg by mouth., Disp: , Rfl:  .  apixaban (ELIQUIS) 5 MG TABS tablet, TAKE 1 TABLET TWICE A DAY, Disp: , Rfl:  .  aspirin 325 MG tablet, Take 325 mg by mouth daily., Disp: , Rfl:  .  calcium carbonate (OS-CAL) 600 MG TABS, Take 600 mg by mouth 2 (two) times daily with a meal., Disp: , Rfl:  .  Cholecalciferol (VITAMIN D-1000 MAX ST) 1000 UNITS tablet, Take by mouth., Disp: , Rfl:  .  dofetilide (TIKOSYN) 500 MCG capsule, TAKE 1 CAPSULE EVERY 12 HOURS, Disp: , Rfl:  .  ferrous fumarate (HEMOCYTE - 106 MG FE) 325 (106 FE) MG TABS, Take 1 tablet by mouth., Disp: , Rfl:  .  folic acid (FOLVITE) 1 MG tablet, Take 1 mg by mouth daily., Disp: , Rfl:  .  Magnesium 400 MG CAPS, Take 400 mg by mouth daily., Disp: , Rfl:  .  methotrexate (RHEUMATREX) 2.5 MG tablet, Take 2.5 mg by mouth once a week. Caution:Chemotherapy. Protect from light., Disp: , Rfl:  .  metoprolol (LOPRESSOR) 50 MG tablet, TAKE ONE-HALF (1/2) TABLET TWICE A DAY, Disp: 90 tablet, Rfl: 3 .  NASCOBAL 500 MCG/0.1ML SOLN, USE ONE SPRAY IN 1 NOSTRIL EVERY WEEK, Disp: 1.3 mL, Rfl: 12 .  NEXIUM 40 MG capsule, TAKE 1 CAPSULE TWICE A DAY (Patient taking differently: TAKE 1 CAPSULE  A DAY), Disp: 180 capsule, Rfl: 4 .  simvastatin (ZOCOR) 20 MG tablet, Take 1 tablet (20 mg total) by mouth daily., Disp: 90 tablet, Rfl: 1 .  enalapril (VASOTEC) 5 MG tablet, Take 5 mg by mouth 2  (two) times daily., Disp: , Rfl:  .  hydroxychloroquine (PLAQUENIL) 200 MG tablet, Take by mouth daily., Disp: , Rfl:  .  SPIRIVA HANDIHALER 18 MCG inhalation capsule, INHALE THE CONTENTS OF 1 CAPSULE THROUGH INHALATION DEVICE DAILY (Patient not taking: Reported on 04/17/2017), Disp: 90 capsule, Rfl: 4  Patient Care Team: Birdie Sons, MD as PCP - General (Family Medicine) Thomasene Ripple, MD as Attending Physician (Vascular Surgery) Emmaline Kluver., MD (Rheumatology) Christoper Allegra, NP as Nurse Practitioner (Cardiology) Alexis Frock, MD as Consulting Physician (Urology)     Objective:   Vitals: BP 110/70 (BP Location: Right Arm, Patient Position: Sitting, Cuff Size: Normal)   Pulse 97  Temp 98 F (36.7 C) (Oral)   Resp 16   Ht 5\' 7"  (1.702 m)   Wt 120 lb (54.4 kg)   SpO2 97%   BMI 18.79 kg/m   Physical Exam   General Appearance:    Alert, cooperative, no distress  Eyes:    PERRL, conjunctiva/corneas clear, EOM's intact       Lungs:     Clear to auscultation bilaterally, respirations unlabored  Heart:   Irregularly irregular rhythm. Normal rate   Neurologic:   Awake, alert, oriented x 3. No apparent focal neurological           defect.        Activities of Daily Living No flowsheet data found.  Fall Risk Assessment Fall Risk  04/17/2017 02/09/2017 04/02/2016 12/25/2014  Falls in the past year? Yes No No No  Number falls in past yr: 1 - - -  Injury with Fall? Yes - - -  Comment crack rib and clavical, collabsed lung - - -     Depression Screen PHQ 2/9 Scores 04/17/2017 02/09/2017 04/02/2016 12/25/2014  PHQ - 2 Score 0 0 0 0  PHQ- 9 Score 4 - - -    Cognitive Testing - 6-CIT  Correct? Score   What year is it? yes 0 0 or 4  What month is it? yes 0 0 or 3  Memorize:    Pia Mau,  41,  [place], yes  0   What time is it? (within 1 hour) yes 0 0 or 3  Count backwards from 20 yes 0 0, 2, or 4  Name the months of the year yes 0 0, 2, or 4  Repeat  name & address above yes 4 0, 2, 4, 6, 8, or 10       TOTAL SCORE  4/28   Interpretation:  Normal  Normal (0-7) Abnormal (8-28)    Audit-C Alcohol Use Screening  Question Answer Points  How often do you have alcoholic drink? never 0  On days you do drink alcohol, how many drinks do you typically consume? 0 0  How oftey will you drink 6 or more in a total? never 0  Total Score:  0   A score of 3 or more in women, and 4 or more in men indicates increased risk for alcohol abuse, EXCEPT if all of the points are from question 1.     Assessment & Plan:     Annual Wellness Visit  Reviewed patient's Family Medical History Reviewed and updated list of patient's medical providers Assessment of cognitive impairment was done Assessed patient's functional ability Established a written schedule for health screening [place] Completed and Reviewed  Exercise Activities and Dietary recommendations Goals    None      Immunization History  Administered Date(s) Administered  . Pneumococcal Conjugate-13 07/14/2014  . Pneumococcal Polysaccharide-23 08/29/2009    Health Maintenance  Topic Date Due  . Hepatitis C Screening  January 01, 1946  . TETANUS/TDAP  09/24/1963  . COLONOSCOPY  09/24/1994  . PNA vac Low Risk Adult (2 of 2 - PPSV23) 07/15/2015  . INFLUENZA VACCINE  01/14/2017     Discussed health benefits of physical activity, and encouraged him to engage in regular exercise appropriate for his age and condition.    ------------------------------------------------------------------------------------------------------------ 1. CAFL (chronic airflow limitation) (HCC) Change from Spiriva to  - umeclidinium-vilanterol (ANORO ELLIPTA) 62.5-25 MCG/INH AEPB; Inhale 1 puff into the lungs daily.  Dispense: 60  each; Refill: 5  2. Osteoporosis, unspecified osteoporosis type, unspecified pathological fracture presence Continue alendronate  3. Rheumatoid arthritis,  involving unspecified site, unspecified rheumatoid factor presence ([place]) Stable on current regiment per rheumatoogy.   4. Pure hypercholesterolemia He is tolerating simvastatin well with no adverse effects.   - Lipid panel  5. Atrial fibrillation by electrocardiogram (HCC) Asymptomatic. Compliant with medication.  Continue aggressive risk factor modification.    6. Prostate cancer screening  - PSA  7. Need for hepatitis C screening test  - Hepatitis C antibody  8. Colon cancer screening  - Cologuard  9. Dyspnea, unspecified type  - Brain natriuretic peptide - EKG 12-Lead    [name], MD  [facility]

## 2017-04-18 LAB — LIPID PANEL
Cholesterol: 121 mg/dL (ref <200)
HDL: 58 mg/dL (ref >40)
LDL CHOLESTEROL (CALC): 49 mg/dL
NON-HDL CHOLESTEROL (CALC): 63 mg/dL (ref <130)
TRIGLYCERIDES: 68 mg/dL (ref <150)
Total CHOL/HDL Ratio: 2.1 (calc) (ref <5.0)

## 2017-04-18 LAB — PSA: PSA: 0.1 ng/mL (ref <=4.0)

## 2017-04-18 LAB — BRAIN NATRIURETIC PEPTIDE: Brain Natriuretic Peptide: 153 pg/mL — ABNORMAL HIGH (ref <100)

## 2017-04-18 LAB — HEPATITIS C ANTIBODY
HEP C AB: NONREACTIVE
SIGNAL TO CUT-OFF: 0.03 (ref <1.00)

## 2017-04-23 DIAGNOSIS — M0579 Rheumatoid arthritis with rheumatoid factor of multiple sites without organ or systems involvement: Secondary | ICD-10-CM | POA: Diagnosis not present

## 2017-04-23 DIAGNOSIS — Z79899 Other long term (current) drug therapy: Secondary | ICD-10-CM | POA: Diagnosis not present

## 2017-04-28 ENCOUNTER — Telehealth: Payer: Self-pay | Admitting: Family Medicine

## 2017-04-28 DIAGNOSIS — J449 Chronic obstructive pulmonary disease, unspecified: Secondary | ICD-10-CM

## 2017-04-28 MED ORDER — UMECLIDINIUM-VILANTEROL 62.5-25 MCG/INH IN AEPB: 1.0000 | INHALATION_SPRAY | Freq: Every day | RESPIRATORY_TRACT | 4 refills | Status: DC

## 2017-04-28 MED ORDER — UMECLIDINIUM-VILANTEROL 62.5-25 MCG/INH IN AEPB: 1.0000 | INHALATION_SPRAY | Freq: Every day | RESPIRATORY_TRACT | 0 refills | Status: DC

## 2017-04-28 NOTE — Telephone Encounter (Signed)
Ok to refill? Please advise. Thanks!  

## 2017-04-28 NOTE — Telephone Encounter (Signed)
Pt's daughter Lexine Baton stated that pt got a sample of umeclidinium-vilanterol (ANORO ELLIPTA) 62.5-25 MCG/INH AEPB and an Rx for the medication was supposed to be sent to [facility] but it wasn't. Pt is out of the medication and daughter asked that one inhaler be sent to [facility] and the rest to [facility]. Please advise. Thanks TNP

## 2017-04-30 DIAGNOSIS — M810 Age-related osteoporosis without current pathological fracture: Secondary | ICD-10-CM | POA: Diagnosis not present

## 2017-04-30 DIAGNOSIS — J449 Chronic obstructive pulmonary disease, unspecified: Secondary | ICD-10-CM | POA: Diagnosis not present

## 2017-04-30 DIAGNOSIS — M0579 Rheumatoid arthritis with rheumatoid factor of multiple sites without organ or systems involvement: Secondary | ICD-10-CM | POA: Diagnosis not present

## 2017-05-21 DIAGNOSIS — I4891 Unspecified atrial fibrillation: Secondary | ICD-10-CM | POA: Diagnosis not present

## 2017-05-21 DIAGNOSIS — Z79899 Other long term (current) drug therapy: Secondary | ICD-10-CM | POA: Diagnosis not present

## 2017-05-21 DIAGNOSIS — L602 Onychogryphosis: Secondary | ICD-10-CM | POA: Diagnosis not present

## 2017-05-21 DIAGNOSIS — J449 Chronic obstructive pulmonary disease, unspecified: Secondary | ICD-10-CM | POA: Diagnosis not present

## 2017-05-21 DIAGNOSIS — Z23 Encounter for immunization: Secondary | ICD-10-CM | POA: Diagnosis not present

## 2017-05-28 DIAGNOSIS — Z23 Encounter for immunization: Secondary | ICD-10-CM | POA: Diagnosis not present

## 2017-05-28 DIAGNOSIS — R0602 Shortness of breath: Secondary | ICD-10-CM | POA: Diagnosis not present

## 2017-05-28 DIAGNOSIS — J449 Chronic obstructive pulmonary disease, unspecified: Secondary | ICD-10-CM | POA: Diagnosis not present

## 2017-05-28 DIAGNOSIS — J432 Centrilobular emphysema: Secondary | ICD-10-CM | POA: Diagnosis not present

## 2017-05-29 DIAGNOSIS — I4891 Unspecified atrial fibrillation: Secondary | ICD-10-CM | POA: Diagnosis not present

## 2017-05-29 DIAGNOSIS — Z13220 Encounter for screening for lipoid disorders: Secondary | ICD-10-CM | POA: Diagnosis not present

## 2017-05-29 DIAGNOSIS — R0609 Other forms of dyspnea: Secondary | ICD-10-CM | POA: Diagnosis not present

## 2017-05-29 DIAGNOSIS — I824Y9 Acute embolism and thrombosis of unspecified deep veins of unspecified proximal lower extremity: Secondary | ICD-10-CM | POA: Diagnosis not present

## 2017-05-29 DIAGNOSIS — I714 Abdominal aortic aneurysm, without rupture: Secondary | ICD-10-CM | POA: Diagnosis not present

## 2017-06-19 DIAGNOSIS — I351 Nonrheumatic aortic (valve) insufficiency: Secondary | ICD-10-CM | POA: Diagnosis not present

## 2017-06-19 DIAGNOSIS — I371 Nonrheumatic pulmonary valve insufficiency: Secondary | ICD-10-CM | POA: Diagnosis not present

## 2017-06-19 DIAGNOSIS — I082 Rheumatic disorders of both aortic and tricuspid valves: Secondary | ICD-10-CM | POA: Diagnosis not present

## 2017-06-19 DIAGNOSIS — I4891 Unspecified atrial fibrillation: Secondary | ICD-10-CM | POA: Diagnosis not present

## 2017-06-30 ENCOUNTER — Other Ambulatory Visit: Payer: Self-pay | Admitting: Family Medicine

## 2017-07-23 DIAGNOSIS — B351 Tinea unguium: Secondary | ICD-10-CM | POA: Diagnosis not present

## 2017-07-23 DIAGNOSIS — M2011 Hallux valgus (acquired), right foot: Secondary | ICD-10-CM | POA: Diagnosis not present

## 2017-07-23 DIAGNOSIS — R414 Neurologic neglect syndrome: Secondary | ICD-10-CM | POA: Diagnosis not present

## 2017-07-23 DIAGNOSIS — M21611 Bunion of right foot: Secondary | ICD-10-CM | POA: Diagnosis not present

## 2017-07-23 DIAGNOSIS — L602 Onychogryphosis: Secondary | ICD-10-CM | POA: Diagnosis not present

## 2017-07-23 DIAGNOSIS — M21612 Bunion of left foot: Secondary | ICD-10-CM | POA: Diagnosis not present

## 2017-07-23 DIAGNOSIS — M2012 Hallux valgus (acquired), left foot: Secondary | ICD-10-CM | POA: Diagnosis not present

## 2017-08-27 DIAGNOSIS — E7800 Pure hypercholesterolemia, unspecified: Secondary | ICD-10-CM | POA: Diagnosis not present

## 2017-08-27 DIAGNOSIS — J432 Centrilobular emphysema: Secondary | ICD-10-CM | POA: Diagnosis not present

## 2017-08-27 DIAGNOSIS — Z79899 Other long term (current) drug therapy: Secondary | ICD-10-CM | POA: Diagnosis not present

## 2017-09-11 DIAGNOSIS — M05711 Rheumatoid arthritis with rheumatoid factor of right shoulder without organ or systems involvement: Secondary | ICD-10-CM | POA: Diagnosis not present

## 2017-09-11 DIAGNOSIS — H04123 Dry eye syndrome of bilateral lacrimal glands: Secondary | ICD-10-CM | POA: Diagnosis not present

## 2017-09-11 DIAGNOSIS — I480 Paroxysmal atrial fibrillation: Secondary | ICD-10-CM | POA: Diagnosis not present

## 2017-09-11 DIAGNOSIS — H524 Presbyopia: Secondary | ICD-10-CM | POA: Diagnosis not present

## 2017-09-11 DIAGNOSIS — H26493 Other secondary cataract, bilateral: Secondary | ICD-10-CM | POA: Diagnosis not present

## 2017-09-11 DIAGNOSIS — I714 Abdominal aortic aneurysm, without rupture: Secondary | ICD-10-CM | POA: Diagnosis not present

## 2017-09-11 DIAGNOSIS — Z8739 Personal history of other diseases of the musculoskeletal system and connective tissue: Secondary | ICD-10-CM | POA: Diagnosis not present

## 2017-09-14 DIAGNOSIS — R262 Difficulty in walking, not elsewhere classified: Secondary | ICD-10-CM | POA: Diagnosis not present

## 2017-11-03 DIAGNOSIS — R262 Difficulty in walking, not elsewhere classified: Secondary | ICD-10-CM | POA: Diagnosis not present

## 2017-11-03 DIAGNOSIS — J449 Chronic obstructive pulmonary disease, unspecified: Secondary | ICD-10-CM | POA: Diagnosis not present

## 2017-11-04 DIAGNOSIS — R262 Difficulty in walking, not elsewhere classified: Secondary | ICD-10-CM | POA: Diagnosis not present

## 2017-11-04 DIAGNOSIS — J449 Chronic obstructive pulmonary disease, unspecified: Secondary | ICD-10-CM | POA: Diagnosis not present

## 2017-11-05 DIAGNOSIS — J449 Chronic obstructive pulmonary disease, unspecified: Secondary | ICD-10-CM | POA: Diagnosis not present

## 2017-11-05 DIAGNOSIS — R262 Difficulty in walking, not elsewhere classified: Secondary | ICD-10-CM | POA: Diagnosis not present

## 2017-11-06 DIAGNOSIS — R262 Difficulty in walking, not elsewhere classified: Secondary | ICD-10-CM | POA: Diagnosis not present

## 2017-11-06 DIAGNOSIS — J449 Chronic obstructive pulmonary disease, unspecified: Secondary | ICD-10-CM | POA: Diagnosis not present

## 2017-11-10 DIAGNOSIS — J449 Chronic obstructive pulmonary disease, unspecified: Secondary | ICD-10-CM | POA: Diagnosis not present

## 2017-11-10 DIAGNOSIS — R262 Difficulty in walking, not elsewhere classified: Secondary | ICD-10-CM | POA: Diagnosis not present

## 2017-11-11 DIAGNOSIS — J449 Chronic obstructive pulmonary disease, unspecified: Secondary | ICD-10-CM | POA: Diagnosis not present

## 2017-11-11 DIAGNOSIS — R262 Difficulty in walking, not elsewhere classified: Secondary | ICD-10-CM | POA: Diagnosis not present

## 2017-11-12 DIAGNOSIS — J449 Chronic obstructive pulmonary disease, unspecified: Secondary | ICD-10-CM | POA: Diagnosis not present

## 2017-11-12 DIAGNOSIS — R262 Difficulty in walking, not elsewhere classified: Secondary | ICD-10-CM | POA: Diagnosis not present

## 2017-11-13 DIAGNOSIS — R262 Difficulty in walking, not elsewhere classified: Secondary | ICD-10-CM | POA: Diagnosis not present

## 2017-11-13 DIAGNOSIS — J449 Chronic obstructive pulmonary disease, unspecified: Secondary | ICD-10-CM | POA: Diagnosis not present

## 2017-11-16 DIAGNOSIS — R262 Difficulty in walking, not elsewhere classified: Secondary | ICD-10-CM | POA: Diagnosis not present

## 2017-11-16 DIAGNOSIS — J449 Chronic obstructive pulmonary disease, unspecified: Secondary | ICD-10-CM | POA: Diagnosis not present

## 2017-11-17 DIAGNOSIS — J449 Chronic obstructive pulmonary disease, unspecified: Secondary | ICD-10-CM | POA: Diagnosis not present

## 2017-11-17 DIAGNOSIS — R262 Difficulty in walking, not elsewhere classified: Secondary | ICD-10-CM | POA: Diagnosis not present

## 2017-11-18 DIAGNOSIS — J449 Chronic obstructive pulmonary disease, unspecified: Secondary | ICD-10-CM | POA: Diagnosis not present

## 2017-11-18 DIAGNOSIS — R262 Difficulty in walking, not elsewhere classified: Secondary | ICD-10-CM | POA: Diagnosis not present

## 2017-11-19 DIAGNOSIS — J449 Chronic obstructive pulmonary disease, unspecified: Secondary | ICD-10-CM | POA: Diagnosis not present

## 2017-11-19 DIAGNOSIS — R262 Difficulty in walking, not elsewhere classified: Secondary | ICD-10-CM | POA: Diagnosis not present

## 2017-11-20 DIAGNOSIS — J449 Chronic obstructive pulmonary disease, unspecified: Secondary | ICD-10-CM | POA: Diagnosis not present

## 2017-11-20 DIAGNOSIS — R262 Difficulty in walking, not elsewhere classified: Secondary | ICD-10-CM | POA: Diagnosis not present

## 2017-11-23 DIAGNOSIS — Z79899 Other long term (current) drug therapy: Secondary | ICD-10-CM | POA: Diagnosis not present

## 2017-11-24 DIAGNOSIS — R262 Difficulty in walking, not elsewhere classified: Secondary | ICD-10-CM | POA: Diagnosis not present

## 2017-11-24 DIAGNOSIS — J449 Chronic obstructive pulmonary disease, unspecified: Secondary | ICD-10-CM | POA: Diagnosis not present

## 2017-11-25 DIAGNOSIS — J449 Chronic obstructive pulmonary disease, unspecified: Secondary | ICD-10-CM | POA: Diagnosis not present

## 2017-11-25 DIAGNOSIS — R262 Difficulty in walking, not elsewhere classified: Secondary | ICD-10-CM | POA: Diagnosis not present

## 2017-11-26 DIAGNOSIS — J449 Chronic obstructive pulmonary disease, unspecified: Secondary | ICD-10-CM | POA: Diagnosis not present

## 2017-11-26 DIAGNOSIS — R262 Difficulty in walking, not elsewhere classified: Secondary | ICD-10-CM | POA: Diagnosis not present

## 2017-11-27 DIAGNOSIS — R262 Difficulty in walking, not elsewhere classified: Secondary | ICD-10-CM | POA: Diagnosis not present

## 2017-11-27 DIAGNOSIS — J449 Chronic obstructive pulmonary disease, unspecified: Secondary | ICD-10-CM | POA: Diagnosis not present

## 2017-11-30 DIAGNOSIS — R262 Difficulty in walking, not elsewhere classified: Secondary | ICD-10-CM | POA: Diagnosis not present

## 2017-11-30 DIAGNOSIS — J449 Chronic obstructive pulmonary disease, unspecified: Secondary | ICD-10-CM | POA: Diagnosis not present

## 2017-12-01 DIAGNOSIS — J449 Chronic obstructive pulmonary disease, unspecified: Secondary | ICD-10-CM | POA: Diagnosis not present

## 2017-12-01 DIAGNOSIS — R262 Difficulty in walking, not elsewhere classified: Secondary | ICD-10-CM | POA: Diagnosis not present

## 2017-12-02 DIAGNOSIS — J449 Chronic obstructive pulmonary disease, unspecified: Secondary | ICD-10-CM | POA: Diagnosis not present

## 2017-12-02 DIAGNOSIS — R262 Difficulty in walking, not elsewhere classified: Secondary | ICD-10-CM | POA: Diagnosis not present

## 2017-12-03 DIAGNOSIS — R262 Difficulty in walking, not elsewhere classified: Secondary | ICD-10-CM | POA: Diagnosis not present

## 2017-12-03 DIAGNOSIS — J449 Chronic obstructive pulmonary disease, unspecified: Secondary | ICD-10-CM | POA: Diagnosis not present

## 2017-12-04 DIAGNOSIS — Z96641 Presence of right artificial hip joint: Secondary | ICD-10-CM | POA: Diagnosis not present

## 2017-12-04 DIAGNOSIS — M810 Age-related osteoporosis without current pathological fracture: Secondary | ICD-10-CM | POA: Diagnosis not present

## 2017-12-04 DIAGNOSIS — J449 Chronic obstructive pulmonary disease, unspecified: Secondary | ICD-10-CM | POA: Diagnosis not present

## 2017-12-04 DIAGNOSIS — R262 Difficulty in walking, not elsewhere classified: Secondary | ICD-10-CM | POA: Diagnosis not present

## 2017-12-04 DIAGNOSIS — M25551 Pain in right hip: Secondary | ICD-10-CM | POA: Diagnosis not present

## 2017-12-18 DIAGNOSIS — M109 Gout, unspecified: Secondary | ICD-10-CM | POA: Diagnosis not present

## 2017-12-18 DIAGNOSIS — M069 Rheumatoid arthritis, unspecified: Secondary | ICD-10-CM | POA: Diagnosis not present

## 2017-12-18 DIAGNOSIS — M21612 Bunion of left foot: Secondary | ICD-10-CM | POA: Diagnosis not present

## 2017-12-18 DIAGNOSIS — I252 Old myocardial infarction: Secondary | ICD-10-CM | POA: Diagnosis not present

## 2017-12-18 DIAGNOSIS — M2011 Hallux valgus (acquired), right foot: Secondary | ICD-10-CM | POA: Diagnosis not present

## 2017-12-18 DIAGNOSIS — I4891 Unspecified atrial fibrillation: Secondary | ICD-10-CM | POA: Diagnosis not present

## 2017-12-18 DIAGNOSIS — T8484XA Pain due to internal orthopedic prosthetic devices, implants and grafts, initial encounter: Secondary | ICD-10-CM | POA: Diagnosis not present

## 2017-12-18 DIAGNOSIS — C61 Malignant neoplasm of prostate: Secondary | ICD-10-CM | POA: Diagnosis not present

## 2017-12-18 DIAGNOSIS — I2699 Other pulmonary embolism without acute cor pulmonale: Secondary | ICD-10-CM | POA: Diagnosis not present

## 2017-12-18 DIAGNOSIS — Z87891 Personal history of nicotine dependence: Secondary | ICD-10-CM | POA: Diagnosis not present

## 2017-12-18 DIAGNOSIS — I7103 Dissection of thoracoabdominal aorta: Secondary | ICD-10-CM | POA: Diagnosis not present

## 2017-12-18 DIAGNOSIS — I7100 Dissection of unspecified site of aorta: Secondary | ICD-10-CM | POA: Diagnosis not present

## 2017-12-18 DIAGNOSIS — M2012 Hallux valgus (acquired), left foot: Secondary | ICD-10-CM | POA: Diagnosis not present

## 2017-12-18 DIAGNOSIS — I10 Essential (primary) hypertension: Secondary | ICD-10-CM | POA: Diagnosis not present

## 2017-12-18 DIAGNOSIS — I714 Abdominal aortic aneurysm, without rupture: Secondary | ICD-10-CM | POA: Diagnosis not present

## 2017-12-18 DIAGNOSIS — K219 Gastro-esophageal reflux disease without esophagitis: Secondary | ICD-10-CM | POA: Diagnosis not present

## 2017-12-18 DIAGNOSIS — E538 Deficiency of other specified B group vitamins: Secondary | ICD-10-CM | POA: Diagnosis not present

## 2017-12-18 DIAGNOSIS — E785 Hyperlipidemia, unspecified: Secondary | ICD-10-CM | POA: Diagnosis not present

## 2017-12-18 DIAGNOSIS — M810 Age-related osteoporosis without current pathological fracture: Secondary | ICD-10-CM | POA: Diagnosis not present

## 2017-12-18 DIAGNOSIS — I824Y9 Acute embolism and thrombosis of unspecified deep veins of unspecified proximal lower extremity: Secondary | ICD-10-CM | POA: Diagnosis not present

## 2017-12-18 DIAGNOSIS — J432 Centrilobular emphysema: Secondary | ICD-10-CM | POA: Diagnosis not present

## 2017-12-18 DIAGNOSIS — M21611 Bunion of right foot: Secondary | ICD-10-CM | POA: Diagnosis not present

## 2017-12-18 DIAGNOSIS — D638 Anemia in other chronic diseases classified elsewhere: Secondary | ICD-10-CM | POA: Diagnosis not present

## 2017-12-30 DIAGNOSIS — I4891 Unspecified atrial fibrillation: Secondary | ICD-10-CM | POA: Diagnosis not present

## 2017-12-30 DIAGNOSIS — I7103 Dissection of thoracoabdominal aorta: Secondary | ICD-10-CM | POA: Diagnosis not present

## 2017-12-30 DIAGNOSIS — I10 Essential (primary) hypertension: Secondary | ICD-10-CM | POA: Diagnosis not present

## 2017-12-30 DIAGNOSIS — I714 Abdominal aortic aneurysm, without rupture: Secondary | ICD-10-CM | POA: Diagnosis not present

## 2017-12-30 DIAGNOSIS — I2699 Other pulmonary embolism without acute cor pulmonale: Secondary | ICD-10-CM | POA: Diagnosis not present

## 2017-12-30 DIAGNOSIS — I252 Old myocardial infarction: Secondary | ICD-10-CM | POA: Diagnosis not present

## 2017-12-30 DIAGNOSIS — J432 Centrilobular emphysema: Secondary | ICD-10-CM | POA: Diagnosis not present

## 2017-12-30 DIAGNOSIS — T8484XD Pain due to internal orthopedic prosthetic devices, implants and grafts, subsequent encounter: Secondary | ICD-10-CM | POA: Diagnosis not present

## 2017-12-30 DIAGNOSIS — I7100 Dissection of unspecified site of aorta: Secondary | ICD-10-CM | POA: Diagnosis not present

## 2017-12-30 DIAGNOSIS — I824Y9 Acute embolism and thrombosis of unspecified deep veins of unspecified proximal lower extremity: Secondary | ICD-10-CM | POA: Diagnosis not present

## 2017-12-30 DIAGNOSIS — K219 Gastro-esophageal reflux disease without esophagitis: Secondary | ICD-10-CM | POA: Diagnosis not present

## 2017-12-30 DIAGNOSIS — M810 Age-related osteoporosis without current pathological fracture: Secondary | ICD-10-CM | POA: Diagnosis not present

## 2018-01-26 DIAGNOSIS — W19XXXA Unspecified fall, initial encounter: Secondary | ICD-10-CM | POA: Diagnosis not present

## 2018-01-26 DIAGNOSIS — Y92009 Unspecified place in unspecified non-institutional (private) residence as the place of occurrence of the external cause: Secondary | ICD-10-CM | POA: Diagnosis not present

## 2018-01-26 DIAGNOSIS — S8992XA Unspecified injury of left lower leg, initial encounter: Secondary | ICD-10-CM | POA: Diagnosis not present

## 2018-01-26 DIAGNOSIS — S8991XA Unspecified injury of right lower leg, initial encounter: Secondary | ICD-10-CM | POA: Diagnosis not present

## 2018-03-15 DIAGNOSIS — I824Y9 Acute embolism and thrombosis of unspecified deep veins of unspecified proximal lower extremity: Secondary | ICD-10-CM | POA: Diagnosis not present

## 2018-03-15 DIAGNOSIS — M19041 Primary osteoarthritis, right hand: Secondary | ICD-10-CM | POA: Diagnosis not present

## 2018-03-15 DIAGNOSIS — I714 Abdominal aortic aneurysm, without rupture: Secondary | ICD-10-CM | POA: Diagnosis not present

## 2018-03-15 DIAGNOSIS — I10 Essential (primary) hypertension: Secondary | ICD-10-CM | POA: Diagnosis not present

## 2018-03-15 DIAGNOSIS — M150 Primary generalized (osteo)arthritis: Secondary | ICD-10-CM | POA: Diagnosis not present

## 2018-03-15 DIAGNOSIS — Z79899 Other long term (current) drug therapy: Secondary | ICD-10-CM | POA: Diagnosis not present

## 2018-03-15 DIAGNOSIS — M810 Age-related osteoporosis without current pathological fracture: Secondary | ICD-10-CM | POA: Diagnosis not present

## 2018-03-15 DIAGNOSIS — I351 Nonrheumatic aortic (valve) insufficiency: Secondary | ICD-10-CM | POA: Diagnosis not present

## 2018-03-15 DIAGNOSIS — E785 Hyperlipidemia, unspecified: Secondary | ICD-10-CM | POA: Diagnosis not present

## 2018-03-15 DIAGNOSIS — M8589 Other specified disorders of bone density and structure, multiple sites: Secondary | ICD-10-CM | POA: Diagnosis not present

## 2018-03-15 DIAGNOSIS — M19042 Primary osteoarthritis, left hand: Secondary | ICD-10-CM | POA: Diagnosis not present

## 2018-03-15 DIAGNOSIS — I4891 Unspecified atrial fibrillation: Secondary | ICD-10-CM | POA: Diagnosis not present

## 2018-03-15 DIAGNOSIS — M0579 Rheumatoid arthritis with rheumatoid factor of multiple sites without organ or systems involvement: Secondary | ICD-10-CM | POA: Diagnosis not present

## 2018-03-15 DIAGNOSIS — M2550 Pain in unspecified joint: Secondary | ICD-10-CM | POA: Diagnosis not present

## 2018-03-18 DIAGNOSIS — E785 Hyperlipidemia, unspecified: Secondary | ICD-10-CM | POA: Diagnosis not present

## 2018-03-18 DIAGNOSIS — I351 Nonrheumatic aortic (valve) insufficiency: Secondary | ICD-10-CM | POA: Diagnosis not present

## 2018-03-18 DIAGNOSIS — Z96641 Presence of right artificial hip joint: Secondary | ICD-10-CM | POA: Diagnosis not present

## 2018-03-18 DIAGNOSIS — Z09 Encounter for follow-up examination after completed treatment for conditions other than malignant neoplasm: Secondary | ICD-10-CM | POA: Diagnosis not present

## 2018-03-18 DIAGNOSIS — I714 Abdominal aortic aneurysm, without rupture: Secondary | ICD-10-CM | POA: Diagnosis not present

## 2018-03-18 DIAGNOSIS — Z87891 Personal history of nicotine dependence: Secondary | ICD-10-CM | POA: Diagnosis not present

## 2018-03-18 DIAGNOSIS — Z006 Encounter for examination for normal comparison and control in clinical research program: Secondary | ICD-10-CM | POA: Diagnosis not present

## 2018-03-18 DIAGNOSIS — M810 Age-related osteoporosis without current pathological fracture: Secondary | ICD-10-CM | POA: Diagnosis not present

## 2018-03-18 DIAGNOSIS — Z7901 Long term (current) use of anticoagulants: Secondary | ICD-10-CM | POA: Diagnosis not present

## 2018-03-18 DIAGNOSIS — I252 Old myocardial infarction: Secondary | ICD-10-CM | POA: Diagnosis not present

## 2018-03-18 DIAGNOSIS — T8484XD Pain due to internal orthopedic prosthetic devices, implants and grafts, subsequent encounter: Secondary | ICD-10-CM | POA: Diagnosis not present

## 2018-03-18 DIAGNOSIS — M7150 Other bursitis, not elsewhere classified, unspecified site: Secondary | ICD-10-CM | POA: Diagnosis not present

## 2018-03-18 DIAGNOSIS — M25551 Pain in right hip: Secondary | ICD-10-CM | POA: Diagnosis not present

## 2018-03-18 DIAGNOSIS — I7103 Dissection of thoracoabdominal aorta: Secondary | ICD-10-CM | POA: Diagnosis not present

## 2018-03-18 DIAGNOSIS — I2699 Other pulmonary embolism without acute cor pulmonale: Secondary | ICD-10-CM | POA: Diagnosis not present

## 2018-03-18 DIAGNOSIS — Z95828 Presence of other vascular implants and grafts: Secondary | ICD-10-CM | POA: Diagnosis not present

## 2018-03-18 DIAGNOSIS — I7100 Dissection of unspecified site of aorta: Secondary | ICD-10-CM | POA: Diagnosis not present

## 2018-03-18 DIAGNOSIS — I2510 Atherosclerotic heart disease of native coronary artery without angina pectoris: Secondary | ICD-10-CM | POA: Diagnosis not present

## 2018-03-18 DIAGNOSIS — J432 Centrilobular emphysema: Secondary | ICD-10-CM | POA: Diagnosis not present

## 2018-03-18 DIAGNOSIS — J449 Chronic obstructive pulmonary disease, unspecified: Secondary | ICD-10-CM | POA: Diagnosis not present

## 2018-03-18 DIAGNOSIS — I10 Essential (primary) hypertension: Secondary | ICD-10-CM | POA: Diagnosis not present

## 2018-03-18 DIAGNOSIS — K219 Gastro-esophageal reflux disease without esophagitis: Secondary | ICD-10-CM | POA: Diagnosis not present

## 2018-03-18 DIAGNOSIS — I716 Thoracoabdominal aortic aneurysm, without rupture: Secondary | ICD-10-CM | POA: Diagnosis not present

## 2018-09-09 ENCOUNTER — Other Ambulatory Visit: Payer: Self-pay | Admitting: Family Medicine

## 2018-09-09 DIAGNOSIS — J449 Chronic obstructive pulmonary disease, unspecified: Secondary | ICD-10-CM

## 2021-05-16 DEATH — deceased
